# Patient Record
Sex: Male | Born: 1990 | Race: White | Hispanic: No | Marital: Single | Smoking: Current every day smoker
Health system: Southern US, Community
[De-identification: ages and names within clinical notes are randomized; demographics above are authoritative.]

## PROBLEM LIST (undated history)

## (undated) DIAGNOSIS — K219 Gastro-esophageal reflux disease without esophagitis: Secondary | ICD-10-CM

## (undated) DIAGNOSIS — J982 Interstitial emphysema: Secondary | ICD-10-CM

## (undated) DIAGNOSIS — S82843A Displaced bimalleolar fracture of unspecified lower leg, initial encounter for closed fracture: Secondary | ICD-10-CM

## (undated) DIAGNOSIS — R0602 Shortness of breath: Secondary | ICD-10-CM

## (undated) DIAGNOSIS — F909 Attention-deficit hyperactivity disorder, unspecified type: Secondary | ICD-10-CM

## (undated) DIAGNOSIS — R51 Headache: Secondary | ICD-10-CM

---
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
## 2011-08-07 DIAGNOSIS — S82843A Displaced bimalleolar fracture of unspecified lower leg, initial encounter for closed fracture: Secondary | ICD-10-CM

## 2011-08-07 HISTORY — DX: Displaced bimalleolar fracture of unspecified lower leg, initial encounter for closed fracture: S82.843A

## 2011-08-08 ENCOUNTER — Encounter (HOSPITAL_BASED_OUTPATIENT_CLINIC_OR_DEPARTMENT_OTHER): Payer: Self-pay | Admitting: *Deleted

## 2011-08-10 ENCOUNTER — Encounter (HOSPITAL_BASED_OUTPATIENT_CLINIC_OR_DEPARTMENT_OTHER)
Admission: RE | Disposition: A | Discharge status: Home / Self Care | Payer: Self-pay | Source: Ambulatory Visit | Attending: Orthopedic Surgery

## 2011-08-10 ENCOUNTER — Encounter (HOSPITAL_BASED_OUTPATIENT_CLINIC_OR_DEPARTMENT_OTHER): Payer: Self-pay | Admitting: Anesthesiology

## 2011-08-10 ENCOUNTER — Ambulatory Visit (HOSPITAL_BASED_OUTPATIENT_CLINIC_OR_DEPARTMENT_OTHER)
Admission: RE | Admit: 2011-08-10 | Discharge: 2011-08-10 | Disposition: A | Discharge status: Home / Self Care | Payer: BC Managed Care – PPO | Source: Ambulatory Visit | Attending: Orthopedic Surgery | Admitting: Orthopedic Surgery

## 2011-08-10 ENCOUNTER — Encounter (HOSPITAL_BASED_OUTPATIENT_CLINIC_OR_DEPARTMENT_OTHER): Payer: Self-pay | Admitting: *Deleted

## 2011-08-10 ENCOUNTER — Ambulatory Visit (HOSPITAL_BASED_OUTPATIENT_CLINIC_OR_DEPARTMENT_OTHER): Payer: BC Managed Care – PPO | Admitting: Anesthesiology

## 2011-08-10 ENCOUNTER — Ambulatory Visit (HOSPITAL_COMMUNITY): Payer: BC Managed Care – PPO

## 2011-08-10 DIAGNOSIS — W19XXXA Unspecified fall, initial encounter: Secondary | ICD-10-CM | POA: Insufficient documentation

## 2011-08-10 DIAGNOSIS — K219 Gastro-esophageal reflux disease without esophagitis: Secondary | ICD-10-CM | POA: Insufficient documentation

## 2011-08-10 DIAGNOSIS — Z01812 Encounter for preprocedural laboratory examination: Secondary | ICD-10-CM | POA: Insufficient documentation

## 2011-08-10 DIAGNOSIS — S82843A Displaced bimalleolar fracture of unspecified lower leg, initial encounter for closed fracture: Secondary | ICD-10-CM | POA: Insufficient documentation

## 2011-08-10 DIAGNOSIS — S82899A Other fracture of unspecified lower leg, initial encounter for closed fracture: Secondary | ICD-10-CM

## 2011-08-10 HISTORY — DX: Displaced bimalleolar fracture of unspecified lower leg, initial encounter for closed fracture: S82.843A

## 2011-08-10 HISTORY — PX: ORIF ANKLE FRACTURE: SHX5408

## 2011-08-10 HISTORY — DX: Gastro-esophageal reflux disease without esophagitis: K21.9

## 2011-08-10 SURGERY — OPEN REDUCTION INTERNAL FIXATION (ORIF) ANKLE FRACTURE
Anesthesia: General | Site: Ankle | Laterality: Left | Wound class: Clean

## 2011-08-10 MED ORDER — LACTATED RINGERS IV SOLN
INTRAVENOUS | Status: DC
  Administered 2011-08-10 (×2): via INTRAVENOUS

## 2011-08-10 MED ORDER — FENTANYL CITRATE 0.05 MG/ML IJ SOLN
50.0000 ug | INTRAMUSCULAR | Status: DC | PRN
  Administered 2011-08-10: 100 ug via INTRAVENOUS

## 2011-08-10 MED ORDER — MEPERIDINE HCL 25 MG/ML IJ SOLN: 6.2500 mg | INTRAMUSCULAR | Status: DC | PRN

## 2011-08-10 MED ORDER — CEFAZOLIN SODIUM 1-5 GM-% IV SOLN
1.0000 g | Freq: Once | INTRAVENOUS | Status: AC
  Administered 2011-08-10: 1 g via INTRAVENOUS

## 2011-08-10 MED ORDER — PROPOFOL 10 MG/ML IV EMUL
INTRAVENOUS | Status: DC | PRN
  Administered 2011-08-10: 200 mg via INTRAVENOUS

## 2011-08-10 MED ORDER — ONDANSETRON HCL 4 MG/2ML IJ SOLN
INTRAMUSCULAR | Status: DC | PRN
  Administered 2011-08-10: 4 mg via INTRAVENOUS

## 2011-08-10 MED ORDER — LIDOCAINE HCL (CARDIAC) 20 MG/ML IV SOLN
INTRAVENOUS | Status: DC | PRN
  Administered 2011-08-10: 100 mg via INTRAVENOUS

## 2011-08-10 MED ORDER — HYDROMORPHONE HCL PF 1 MG/ML IJ SOLN: 0.2500 mg | INTRAMUSCULAR | Status: DC | PRN

## 2011-08-10 MED ORDER — OXYCODONE-ACETAMINOPHEN 5-325 MG PO TABS: 1.0000 | ORAL_TABLET | ORAL | Status: AC | PRN

## 2011-08-10 MED ORDER — 0.9 % SODIUM CHLORIDE (POUR BTL) OPTIME
TOPICAL | Status: DC | PRN
  Administered 2011-08-10: 1000 mL

## 2011-08-10 MED ORDER — DEXAMETHASONE SODIUM PHOSPHATE 4 MG/ML IJ SOLN
INTRAMUSCULAR | Status: DC | PRN
  Administered 2011-08-10: 10 mg via INTRAVENOUS

## 2011-08-10 MED ORDER — MORPHINE SULFATE 2 MG/ML IJ SOLN: 0.0500 mg/kg | INTRAMUSCULAR | Status: DC | PRN

## 2011-08-10 MED ORDER — ONDANSETRON HCL 4 MG/2ML IJ SOLN: 4.0000 mg | Freq: Once | INTRAMUSCULAR | Status: DC | PRN

## 2011-08-10 MED ORDER — OXYCODONE-ACETAMINOPHEN 5-325 MG PO TABS
2.0000 | ORAL_TABLET | Freq: Once | ORAL | Status: AC
  Administered 2011-08-10: 2 via ORAL

## 2011-08-10 MED ORDER — MIDAZOLAM HCL 5 MG/5ML IJ SOLN
INTRAMUSCULAR | Status: DC | PRN
  Administered 2011-08-10: 1 mg via INTRAVENOUS

## 2011-08-10 MED ORDER — BUPIVACAINE-EPINEPHRINE PF 0.5-1:200000 % IJ SOLN
INTRAMUSCULAR | Status: DC | PRN
  Administered 2011-08-10: 30 mL

## 2011-08-10 MED ORDER — MIDAZOLAM HCL 2 MG/2ML IJ SOLN
1.0000 mg | INTRAMUSCULAR | Status: DC | PRN
  Administered 2011-08-10: 2 mg via INTRAVENOUS

## 2011-08-10 SURGICAL SUPPLY — 80 items
BANDAGE ACE 4 STERILE (GAUZE/BANDAGES/DRESSINGS) ×2 IMPLANT
BANDAGE ELASTIC 4 VELCRO ST LF (GAUZE/BANDAGES/DRESSINGS) ×2 IMPLANT
BANDAGE ELASTIC 6 VELCRO ST LF (GAUZE/BANDAGES/DRESSINGS) ×2 IMPLANT
BANDAGE ESMARK 6X9 LF (GAUZE/BANDAGES/DRESSINGS) IMPLANT
BENZOIN TINCTURE PRP APPL 2/3 (GAUZE/BANDAGES/DRESSINGS) ×2 IMPLANT
BIT DRILL 2.5X110 QC LCP DISP (BIT) ×2 IMPLANT
BIT DRILL QC 3.5X110 (BIT) ×2 IMPLANT
BLADE SURG 15 STRL LF DISP TIS (BLADE) ×1 IMPLANT
BLADE SURG 15 STRL SS (BLADE) ×1
BLADE SURG ROTATE 9660 (MISCELLANEOUS) ×2 IMPLANT
BNDG COHESIVE 4X5 TAN STRL (GAUZE/BANDAGES/DRESSINGS) ×2 IMPLANT
BNDG ESMARK 4X9 LF (GAUZE/BANDAGES/DRESSINGS) IMPLANT
BNDG ESMARK 6X9 LF (GAUZE/BANDAGES/DRESSINGS)
CANISTER SUCTION 1200CC (MISCELLANEOUS) IMPLANT
CHLORAPREP W/TINT 26ML (MISCELLANEOUS) ×2 IMPLANT
CLOSURE STERI STRIP 1/2 X4 (GAUZE/BANDAGES/DRESSINGS) ×2 IMPLANT
CLOTH BEACON ORANGE TIMEOUT ST (SAFETY) ×2 IMPLANT
COVER TABLE BACK 60X90 (DRAPES) ×2 IMPLANT
DECANTER SPIKE VIAL GLASS SM (MISCELLANEOUS) IMPLANT
DRAPE C-ARM 42X72 X-RAY (DRAPES) ×2 IMPLANT
DRAPE EXTREMITY T 121X128X90 (DRAPE) ×2 IMPLANT
DRAPE OEC MINIVIEW 54X84 (DRAPES) ×2 IMPLANT
DRAPE U 20/CS (DRAPES) ×2 IMPLANT
DRAPE U-SHAPE 47X51 STRL (DRAPES) IMPLANT
DRSG EMULSION OIL 3X3 NADH (GAUZE/BANDAGES/DRESSINGS) IMPLANT
ELECT REM PT RETURN 9FT ADLT (ELECTROSURGICAL) ×2
ELECTRODE REM PT RTRN 9FT ADLT (ELECTROSURGICAL) ×1 IMPLANT
GAUZE SPONGE 4X4 16PLY XRAY LF (GAUZE/BANDAGES/DRESSINGS) IMPLANT
GLOVE BIOGEL M 7.0 STRL (GLOVE) ×2 IMPLANT
GLOVE BIOGEL PI IND STRL 7.0 (GLOVE) ×2 IMPLANT
GLOVE BIOGEL PI IND STRL 8 (GLOVE) ×2 IMPLANT
GLOVE BIOGEL PI INDICATOR 7.0 (GLOVE) ×2
GLOVE BIOGEL PI INDICATOR 8 (GLOVE) ×2
GLOVE ECLIPSE 7.5 STRL STRAW (GLOVE) ×6 IMPLANT
GLOVE SKINSENSE NS SZ6.5 (GLOVE) ×2
GLOVE SKINSENSE STRL SZ6.5 (GLOVE) ×2 IMPLANT
GOWN PREVENTION PLUS XLARGE (GOWN DISPOSABLE) ×2 IMPLANT
GOWN PREVENTION PLUS XXLARGE (GOWN DISPOSABLE) ×2 IMPLANT
KIT 1/3 TUB PL 8H 97M (Orthopedic Implant) ×1 IMPLANT
NEEDLE HYPO 22GX1.5 SAFETY (NEEDLE) IMPLANT
NS IRRIG 1000ML POUR BTL (IV SOLUTION) ×2 IMPLANT
PACK BASIN DAY SURGERY FS (CUSTOM PROCEDURE TRAY) ×2 IMPLANT
PAD CAST 4YDX4 CTTN HI CHSV (CAST SUPPLIES) ×1 IMPLANT
PADDING CAST ABS 4INX4YD NS (CAST SUPPLIES) ×2
PADDING CAST ABS COTTON 4X4 ST (CAST SUPPLIES) ×2 IMPLANT
PADDING CAST COTTON 4X4 STRL (CAST SUPPLIES) ×1
PADDING CAST COTTON 6X4 STRL (CAST SUPPLIES) IMPLANT
PADDING WEBRIL 4 STERILE (GAUZE/BANDAGES/DRESSINGS) ×2 IMPLANT
PADDING WEBRIL 6 STERILE (GAUZE/BANDAGES/DRESSINGS) ×2 IMPLANT
PENCIL BUTTON HOLSTER BLD 10FT (ELECTRODE) ×2 IMPLANT
PROS 1/3 TUB PL 8H 97M (Orthopedic Implant) ×2 IMPLANT
SCREW CORTEX 3.5 12MM (Screw) ×4 IMPLANT
SCREW CORTEX 3.5 14MM (Screw) ×2 IMPLANT
SCREW CORTEX 3.5 18MM (Screw) ×1 IMPLANT
SCREW LOCK CORT ST 3.5X12 (Screw) ×4 IMPLANT
SCREW LOCK CORT ST 3.5X14 (Screw) ×2 IMPLANT
SCREW LOCK CORT ST 3.5X18 (Screw) ×1 IMPLANT
SPLINT FAST PLASTER 5X30 (CAST SUPPLIES) ×15
SPLINT PLASTER CAST FAST 5X30 (CAST SUPPLIES) ×15 IMPLANT
SPLINT PLASTER CAST XFAST 5X30 (CAST SUPPLIES) ×1 IMPLANT
SPLINT PLASTER XFAST SET 5X30 (CAST SUPPLIES) ×1
SPONGE GAUZE 4X4 12PLY (GAUZE/BANDAGES/DRESSINGS) ×2 IMPLANT
SPONGE LAP 4X18 X RAY DECT (DISPOSABLE) IMPLANT
STAPLER VISISTAT (STAPLE) IMPLANT
STOCKINETTE 6  STRL (DRAPES) ×1
STOCKINETTE 6 STRL (DRAPES) ×1 IMPLANT
STRIP CLOSURE SKIN 1/2X4 (GAUZE/BANDAGES/DRESSINGS) IMPLANT
SUCTION FRAZIER TIP 10 FR DISP (SUCTIONS) ×2 IMPLANT
SUT ETHILON 4 0 PS 2 18 (SUTURE) IMPLANT
SUT MON AB 4-0 PC3 18 (SUTURE) ×2 IMPLANT
SUT VIC AB 2-0 SH 27 (SUTURE) ×1
SUT VIC AB 2-0 SH 27XBRD (SUTURE) ×1 IMPLANT
SUT VIC AB 3-0 FS2 27 (SUTURE) IMPLANT
SUT VICRYL 4-0 PS2 18IN ABS (SUTURE) IMPLANT
SYR 20CC LL (SYRINGE) IMPLANT
SYR BULB 3OZ (MISCELLANEOUS) ×2 IMPLANT
TOWEL OR NON WOVEN STRL DISP B (DISPOSABLE) ×2 IMPLANT
TUBE CONNECTING 20X1/4 (TUBING) IMPLANT
UNDERPAD 30X30 INCONTINENT (UNDERPADS AND DIAPERS) ×2 IMPLANT
WATER STERILE IRR 1000ML POUR (IV SOLUTION) ×2 IMPLANT

## 2011-08-10 NOTE — Anesthesia Postprocedure Evaluation (Signed)
Anesthesia Post Note  Patient: Fernando Jimenez [facility]  Procedure(s) Performed:  OPEN REDUCTION INTERNAL FIXATION (ORIF) ANKLE FRACTURE  Anesthesia type: general  Patient location: PACU  Post pain: Pain level controlled  Post assessment: Patient's Cardiovascular Status Stable  Last Vitals:  Filed Vitals:   08/10/11 1715  BP: 138/80  Pulse: 87  Temp: 36.7 C  Resp: 18    Post vital signs: Reviewed and stable  Level of consciousness: sedated  Complications: No apparent anesthesia complications

## 2011-08-10 NOTE — Op Note (Signed)
Procedure(s): OPEN REDUCTION INTERNAL FIXATION (ORIF) ANKLE FRACTURE Procedure Note  Fernando Jimenez [facility] male 21 y.o. 08/10/2011  Procedure(s) and Anesthesia Type:    * OPEN REDUCTION INTERNAL FIXATION (ORIF) BIMALLEOLAR (POSTERIOR AND LATERAL) ANKLE FRACTURE - General with preop regional  Surgeon(s) and Role:    * Mable Paris, MD - Primary   Indications:  21 y.o. male s/p fall with left ankle fracture. Indicated for surgery to promote anatomic restoration of joint.     Surgeon: Mable Paris   Assistants: none  Anesthesia: General endotracheal anesthesia and IV regional  ASA Class: 1    Procedure Detail  OPEN REDUCTION INTERNAL FIXATION (ORIF) ANKLE FRACTURE  Findings: After fixation of lateral malleolus fracture with 8 hole 1/3 tubular plate syndesmosis was stressed and was stable.  Small posterior malleolus fracture did not require fixation.  Estimated Blood Loss:  Minimal         Drains: none  Blood Given: none         Specimens: none        Complications:  * No complications entered in OR log *         Disposition: PACU - hemodynamically stable.         Condition: stable    Procedure:            The patient was identified in the preoperative holding area where I personally marked the operative site after verifying site side and procedure with the patient. The patient was taken back  to the operating room where general anesthesia was induced without Complication. The patient was placed in supine position with a bump under the operative hip. A non sterile tourniquet was applied to the thigh. The patient did receive IV antibiotics prior to the incision.   After the appropriate time-out, the limb was exsanguinated and the tourniquet was elevated to 300 mmHg.   A 10cm lateral incision was made over the fracture site and dissection was carried down the lateral fibula.  The fracture was a exposed and cleaned of hematoma.  Reduction was carried out  using reduction forceps and manipulation of the ankle.  An interfragmentary lag screw was placed.  A 8 hole 1/3 tubular plate was laid laterally and felt to be appropriate sized.  Holes proximal and distal to the fracture were sequentially drill, measured and filled with appropriate sized bicortical and unicortical screws.  Appropriate length and alignment were verified on fluoroscopic imaging.    The syndesmosis was stressed and felt to be completely stable with no widening.  Therefore no syndesmosis screws were placed.  The wounds were then copiously irrigated and closed in layers with 2-0 vicryl in a deep layer and 4-0 monocryl for skin closure.  Sterile dressings were then applied and well padded well molded splint in a plantigrade position was applied.  The tourniquet was let down for total tourniquet time of 46 minutes.  The patient was then allowed to awaken from GA, taken to the PACU in stable condition.  POSTOPERATIVE PLAN: The patient will be non-weightbearing on the operative Extremity and will follow up in 10-14 days for wound check.

## 2011-08-10 NOTE — Anesthesia Preprocedure Evaluation (Signed)
Anesthesia Evaluation  Patient identified by MRN, date of birth, ID band Patient awake    Reviewed: Allergy & Precautions, H&P , NPO status , Patient's Chart, lab work & pertinent test results  Airway Mallampati: I TM Distance: >3 FB Neck ROM: Full    Dental No notable dental hx. (+) Dental Advisory Given   Pulmonary    Pulmonary exam normal       Cardiovascular Regular Normal    Neuro/Psych    GI/Hepatic   Endo/Other    Renal/GU      Musculoskeletal   Abdominal   Peds  Hematology   Anesthesia Other Findings   Reproductive/Obstetrics                           Anesthesia Physical Anesthesia Plan  ASA: I  Anesthesia Plan: General   Post-op Pain Management:    Induction: Intravenous  Airway Management Planned: LMA  Additional Equipment:   Intra-op Plan:   Post-operative Plan: Extubation in OR  Informed Consent: I have reviewed the patients History and Physical, chart, labs and discussed the procedure including the risks, benefits and alternatives for the proposed anesthesia with the patient or authorized representative who has indicated his/her understanding and acceptance.   Dental advisory given  Plan Discussed with: CRNA, Anesthesiologist and Surgeon  Anesthesia Plan Comments:         Anesthesia Quick Evaluation

## 2011-08-10 NOTE — Progress Notes (Signed)
Assisted Dr. Crews with left, ultrasound guided, popliteal block. Side rails up, monitors on throughout procedure. See vital signs in flow sheet. Tolerated Procedure well. 

## 2011-08-10 NOTE — H&P (Signed)
Fernando Jimenez is an 21 y.o. male.   Chief Complaint: L ankle pain  HPI: L ankle fracture dislocation after sledding accident.    Past Medical History  Diagnosis Date  . GERD (gastroesophageal reflux disease)     prn OTC  . Bimalleolar ankle fracture 08/07/2011    left  - with disruption syndesmosis    History reviewed. No pertinent past surgical history.  History reviewed. No pertinent family history. Social History:  reports that he has never smoked. He has never used smokeless tobacco. He reports that he does not drink alcohol or use illicit drugs.  Allergies: No Known Allergies  Medications Prior to Admission  Medication Dose Route Frequency Provider Last Rate Last Dose  . lactated ringers infusion   Intravenous Continuous Bedelia Person, MD 20 mL/hr at 08/10/11 1341     No current outpatient prescriptions on file as of 08/10/2011.    No results found for this or any previous visit (from the past 48 hour(s)). No results found.  Review of Systems  All other systems reviewed and are negative.    Blood pressure 136/88, pulse 65, temperature 98.2 F (36.8 C), temperature source Oral, resp. rate 16, height 5\' 11"  (1.803 m), weight 77.111 kg (170 lb), SpO2 98.00%. Physical Exam  Constitutional: He is oriented to person, place, and time. He appears well-developed and well-nourished.  HENT:  Head: Atraumatic.  Eyes: EOM are normal.  Neck: Neck supple.  Cardiovascular: Intact distal pulses.   Respiratory: Effort normal.  GI: Soft.  Musculoskeletal:       LLE splint taken down.  Minimal swelling, skin intact. NVID   Neurological: He is alert and oriented to person, place, and time.  Skin: Skin is warm and dry.  Psychiatric: He has a normal mood and affect.     Assessment/Plan L ankle fracture dislocation.   Risks / benefits of surgery discussed Consent on chart  NPO for OR Preop antibiotics   Fernando Jimenez Fernando Jimenez 08/10/2011, 2:03 PM

## 2011-08-10 NOTE — Anesthesia Procedure Notes (Addendum)
Procedure Name: LMA Insertion Date/Time: 08/10/2011 3:09 PM Performed by: Zenia Resides D Pre-anesthesia Checklist: Patient identified, Emergency Drugs available, Suction available and Patient being monitored Patient Re-evaluated:Patient Re-evaluated prior to inductionOxygen Delivery Method: Circle System Utilized Preoxygenation: Pre-oxygenation with 100% oxygen Intubation Type: IV induction Ventilation: Mask ventilation without difficulty LMA: LMA inserted LMA Size: 4.0 Number of attempts: 1 Airway Equipment and Method: bite block Placement Confirmation: positive ETCO2 and breath sounds checked- equal and bilateral Tube secured with: Tape Dental Injury: Teeth and Oropharynx as per pre-operative assessment     Anesthesia Regional Block:  Popliteal block  Pre-Anesthetic Checklist: ,, timeout performed, Correct Patient, Correct Site, Correct Laterality, Correct Procedure, Correct Position, site marked, Risks and benefits discussed,  Surgical consent,  Pre-op evaluation,  At surgeon's request and post-op pain management  Laterality: Left and Lower  Prep: chloraprep       Needles:  Injection technique: Single-shot  Needle Type: Echogenic Stimulator Needle     Needle Length: 9cm  Needle Gauge: 22 and 22 G    Additional Needles:  Procedures: ultrasound guided Popliteal block Narrative:  Start time: 08/10/2011 2:38 PM End time: 08/10/2011 2:50 PM Injection made incrementally with aspirations every 5 mL.  Performed by: Personally  Anesthesiologist: Sheldon Silvan  Supraclavicular block

## 2011-08-10 NOTE — Transfer of Care (Signed)
Immediate Anesthesia Transfer of Care Note  Patient: Fernando Jimenez [facility]  Procedure(s) Performed:  OPEN REDUCTION INTERNAL FIXATION (ORIF) ANKLE FRACTURE  Patient Location: PACU  Anesthesia Type: General  Level of Consciousness: awake and oriented  Airway & Oxygen Therapy: Patient Spontanous Breathing and Patient connected to face mask oxygen  Post-op Assessment: Report given to PACU RN and Post -op Vital signs reviewed and stable  Post vital signs: Reviewed and stable  Complications: No apparent anesthesia complications

## 2011-08-11 LAB — POCT HEMOGLOBIN-HEMACUE: Hemoglobin: 13.8 g/dL (ref 13.0–17.0)

## 2011-08-15 ENCOUNTER — Encounter (HOSPITAL_BASED_OUTPATIENT_CLINIC_OR_DEPARTMENT_OTHER): Payer: Self-pay | Admitting: Orthopedic Surgery

## 2013-05-20 ENCOUNTER — Emergency Department (HOSPITAL_COMMUNITY): Payer: BC Managed Care – PPO

## 2013-05-20 ENCOUNTER — Emergency Department (HOSPITAL_BASED_OUTPATIENT_CLINIC_OR_DEPARTMENT_OTHER): Payer: BC Managed Care – PPO

## 2013-05-20 ENCOUNTER — Encounter (HOSPITAL_BASED_OUTPATIENT_CLINIC_OR_DEPARTMENT_OTHER): Payer: Self-pay | Admitting: Emergency Medicine

## 2013-05-20 ENCOUNTER — Inpatient Hospital Stay (HOSPITAL_BASED_OUTPATIENT_CLINIC_OR_DEPARTMENT_OTHER)
Admission: EM | Admit: 2013-05-20 | Discharge: 2013-05-24 | DRG: 541 | Disposition: A | Discharge status: Home / Self Care | Payer: BC Managed Care – PPO | Attending: Pulmonary Disease | Admitting: Pulmonary Disease

## 2013-05-20 DIAGNOSIS — J982 Interstitial emphysema: Principal | ICD-10-CM

## 2013-05-20 DIAGNOSIS — F988 Other specified behavioral and emotional disorders with onset usually occurring in childhood and adolescence: Secondary | ICD-10-CM | POA: Diagnosis present

## 2013-05-20 DIAGNOSIS — R404 Transient alteration of awareness: Secondary | ICD-10-CM | POA: Diagnosis present

## 2013-05-20 DIAGNOSIS — R000 Tachycardia, unspecified: Secondary | ICD-10-CM | POA: Diagnosis present

## 2013-05-20 DIAGNOSIS — R339 Retention of urine, unspecified: Secondary | ICD-10-CM | POA: Diagnosis not present

## 2013-05-20 DIAGNOSIS — R109 Unspecified abdominal pain: Secondary | ICD-10-CM

## 2013-05-20 DIAGNOSIS — F22 Delusional disorders: Secondary | ICD-10-CM | POA: Diagnosis present

## 2013-05-20 DIAGNOSIS — F19931 Other psychoactive substance use, unspecified with withdrawal delirium: Secondary | ICD-10-CM | POA: Diagnosis present

## 2013-05-20 DIAGNOSIS — K223 Perforation of esophagus: Secondary | ICD-10-CM | POA: Diagnosis present

## 2013-05-20 DIAGNOSIS — D72829 Elevated white blood cell count, unspecified: Secondary | ICD-10-CM | POA: Diagnosis present

## 2013-05-20 DIAGNOSIS — R112 Nausea with vomiting, unspecified: Secondary | ICD-10-CM | POA: Diagnosis present

## 2013-05-20 DIAGNOSIS — F909 Attention-deficit hyperactivity disorder, unspecified type: Secondary | ICD-10-CM | POA: Diagnosis present

## 2013-05-20 DIAGNOSIS — N200 Calculus of kidney: Secondary | ICD-10-CM | POA: Diagnosis present

## 2013-05-20 DIAGNOSIS — K219 Gastro-esophageal reflux disease without esophagitis: Secondary | ICD-10-CM | POA: Diagnosis present

## 2013-05-20 DIAGNOSIS — F17200 Nicotine dependence, unspecified, uncomplicated: Secondary | ICD-10-CM | POA: Diagnosis present

## 2013-05-20 DIAGNOSIS — F1910 Other psychoactive substance abuse, uncomplicated: Secondary | ICD-10-CM | POA: Diagnosis present

## 2013-05-20 DIAGNOSIS — G9340 Encephalopathy, unspecified: Secondary | ICD-10-CM | POA: Diagnosis present

## 2013-05-20 DIAGNOSIS — E860 Dehydration: Secondary | ICD-10-CM | POA: Diagnosis present

## 2013-05-20 HISTORY — DX: Headache: R51

## 2013-05-20 HISTORY — DX: Shortness of breath: R06.02

## 2013-05-20 HISTORY — DX: Interstitial emphysema: J98.2

## 2013-05-20 HISTORY — DX: Attention-deficit hyperactivity disorder, unspecified type: F90.9

## 2013-05-20 LAB — BASIC METABOLIC PANEL
BUN: 12 mg/dL (ref 6–23)
CO2: 23 mEq/L (ref 19–32)
Calcium: 11.4 mg/dL — ABNORMAL HIGH (ref 8.4–10.5)
Creatinine, Ser: 1 mg/dL (ref 0.50–1.35)
GFR calc Af Amer: >90 mL/min (ref >90)
Glucose, Bld: 114 mg/dL — ABNORMAL HIGH (ref 70–99)
Potassium: 4.5 mEq/L (ref 3.5–5.1)
Sodium: 139 mEq/L (ref 135–145)

## 2013-05-20 LAB — COMPREHENSIVE METABOLIC PANEL
Albumin: 4.5 g/dL (ref 3.5–5.2)
BUN: 7 mg/dL (ref 6–23)
Chloride: 102 mEq/L (ref 96–112)
Creatinine, Ser: 0.79 mg/dL (ref 0.50–1.35)
GFR calc Af Amer: >90 mL/min (ref >90)
Glucose, Bld: 87 mg/dL (ref 70–99)
Total Bilirubin: 0.6 mg/dL (ref 0.3–1.2)

## 2013-05-20 LAB — URINE MICROSCOPIC-ADD ON

## 2013-05-20 LAB — CBC WITH DIFFERENTIAL/PLATELET
Basophils Absolute: 0 10*3/uL (ref 0.0–0.1)
Basophils Relative: 0 % (ref 0–1)
Eosinophils Absolute: 0 10*3/uL (ref 0.0–0.7)
Eosinophils Relative: 0 % (ref 0–5)
Eosinophils Relative: 0 % (ref 0–5)
HCT: 48.1 % (ref 39.0–52.0)
Hemoglobin: 14.2 g/dL (ref 13.0–17.0)
Lymphocytes Relative: 7 % — ABNORMAL LOW (ref 12–46)
Lymphocytes Relative: 8 % — ABNORMAL LOW (ref 12–46)
Lymphs Abs: 0.8 10*3/uL (ref 0.7–4.0)
Lymphs Abs: 1.4 10*3/uL (ref 0.7–4.0)
MCH: 31.6 pg (ref 26.0–34.0)
MCHC: 35.8 g/dL (ref 30.0–36.0)
MCV: 88.3 fL (ref 78.0–100.0)
MCV: 89.2 fL (ref 78.0–100.0)
Monocytes Absolute: 0.4 10*3/uL (ref 0.1–1.0)
Monocytes Relative: 3 % (ref 3–12)
Monocytes Relative: 4 % (ref 3–12)
Neutro Abs: 15 10*3/uL — ABNORMAL HIGH (ref 1.7–7.7)
Neutrophils Relative %: 90 % — ABNORMAL HIGH (ref 43–77)
Neutrophils Relative %: 87 % — ABNORMAL HIGH (ref 43–77)
Platelets: 208 10*3/uL (ref 150–400)
RBC: 5.45 MIL/uL (ref 4.22–5.81)
RBC: 4.54 MIL/uL (ref 4.22–5.81)
RDW: 12.3 % (ref 11.5–15.5)
RDW: 12.6 % (ref 11.5–15.5)
WBC: 12.6 10*3/uL — ABNORMAL HIGH (ref 4.0–10.5)
WBC: 17.2 10*3/uL — ABNORMAL HIGH (ref 4.0–10.5)

## 2013-05-20 LAB — URINALYSIS, ROUTINE W REFLEX MICROSCOPIC
Glucose, UA: NEGATIVE mg/dL
Ketones, ur: >80 mg/dL — AB
Leukocytes, UA: NEGATIVE
Leukocytes, UA: NEGATIVE
Nitrite: NEGATIVE
Nitrite: NEGATIVE
Protein, ur: 100 mg/dL — AB
Specific Gravity, Urine: 1.034 — ABNORMAL HIGH (ref 1.005–1.030)
Specific Gravity, Urine: 1.015 (ref 1.005–1.030)
Urobilinogen, UA: 0.2 mg/dL (ref 0.0–1.0)
pH: 5.5 (ref 5.0–8.0)
pH: 5.5 (ref 5.0–8.0)

## 2013-05-20 LAB — TSH: TSH: 0.861 u[IU]/mL (ref 0.350–4.500)

## 2013-05-20 LAB — GLUCOSE, CAPILLARY
Glucose-Capillary: 93 mg/dL (ref 70–99)
Glucose-Capillary: 90 mg/dL (ref 70–99)
Glucose-Capillary: 90 mg/dL (ref 70–99)

## 2013-05-20 LAB — POCT I-STAT, CHEM 8
Calcium, Ion: 1.13 mmol/L (ref 1.12–1.23)
Glucose, Bld: 111 mg/dL — ABNORMAL HIGH (ref 70–99)
HCT: 53 % — ABNORMAL HIGH (ref 39.0–52.0)
Hemoglobin: 18 g/dL — ABNORMAL HIGH (ref 13.0–17.0)
Potassium: 4.4 mEq/L (ref 3.5–5.1)
TCO2: 22 mmol/L (ref 0–100)

## 2013-05-20 LAB — RAPID URINE DRUG SCREEN, HOSP PERFORMED
Barbiturates: NOT DETECTED
Cocaine: NOT DETECTED

## 2013-05-20 LAB — CG4 I-STAT (LACTIC ACID): Lactic Acid, Venous: 2.22 mmol/L — ABNORMAL HIGH (ref 0.5–2.2)

## 2013-05-20 LAB — INFLUENZA PANEL BY PCR (TYPE A & B)
Influenza A By PCR: NEGATIVE
Influenza B By PCR: NEGATIVE

## 2013-05-20 LAB — MRSA PCR SCREENING: MRSA by PCR: NEGATIVE

## 2013-05-20 LAB — POCT I-STAT 3, VENOUS BLOOD GAS (G3P V)
Acid-base deficit: 5 mmol/L — ABNORMAL HIGH (ref 0.0–2.0)
Patient temperature: 98.6
pCO2, Ven: 36.2 mmHg — ABNORMAL LOW (ref 45.0–50.0)

## 2013-05-20 LAB — LACTIC ACID, PLASMA: Lactic Acid, Venous: 1.2 mmol/L (ref 0.5–2.2)

## 2013-05-20 MED ORDER — VANCOMYCIN HCL IN DEXTROSE 1-5 GM/200ML-% IV SOLN
1000.0000 mg | Freq: Three times a day (TID) | INTRAVENOUS | Status: DC
  Administered 2013-05-20 – 2013-05-21 (×4): 1000 mg via INTRAVENOUS
  Filled 2013-05-20 (×7): qty 200

## 2013-05-20 MED ORDER — SODIUM CHLORIDE 0.9 % IJ SOLN
3.0000 mL | Freq: Two times a day (BID) | INTRAMUSCULAR | Status: DC
  Administered 2013-05-21 – 2013-05-23 (×4): 3 mL via INTRAVENOUS

## 2013-05-20 MED ORDER — MORPHINE SULFATE 2 MG/ML IJ SOLN
1.0000 mg | INTRAMUSCULAR | Status: DC | PRN
  Administered 2013-05-20: 1 mg via INTRAVENOUS
  Filled 2013-05-20: qty 1

## 2013-05-20 MED ORDER — IOHEXOL 300 MG/ML  SOLN: 50.0000 mL | Freq: Once | INTRAMUSCULAR | Status: DC | PRN

## 2013-05-20 MED ORDER — SODIUM CHLORIDE 0.9 % IV BOLUS (SEPSIS)
1000.0000 mL | Freq: Once | INTRAVENOUS | Status: AC
  Administered 2013-05-20: 1000 mL via INTRAVENOUS

## 2013-05-20 MED ORDER — PANTOPRAZOLE SODIUM 40 MG IV SOLR: 40.0000 mg | Freq: Two times a day (BID) | INTRAVENOUS | Status: DC

## 2013-05-20 MED ORDER — SODIUM CHLORIDE 0.9 % IV SOLN
3.0000 g | Freq: Once | INTRAVENOUS | Status: AC
  Administered 2013-05-20: 3 g via INTRAVENOUS
  Filled 2013-05-20: qty 3

## 2013-05-20 MED ORDER — SODIUM CHLORIDE 0.9 % IV SOLN
Freq: Once | INTRAVENOUS | Status: AC
  Administered 2013-05-20: 06:00:00 via INTRAVENOUS

## 2013-05-20 MED ORDER — ONDANSETRON HCL 4 MG/2ML IJ SOLN
INTRAMUSCULAR | Status: AC
  Filled 2013-05-20: qty 2

## 2013-05-20 MED ORDER — METHYLPHENIDATE HCL 5 MG PO TABS: 15.0000 mg | ORAL_TABLET | Freq: Three times a day (TID) | ORAL | Status: DC | PRN

## 2013-05-20 MED ORDER — IOHEXOL 300 MG/ML  SOLN
75.0000 mL | Freq: Once | INTRAMUSCULAR | Status: AC | PRN
  Administered 2013-05-20: 75 mL via INTRAVENOUS

## 2013-05-20 MED ORDER — IMIPENEM-CILASTATIN 500 MG IV SOLR
500.0000 mg | Freq: Three times a day (TID) | INTRAVENOUS | Status: DC
  Administered 2013-05-20 – 2013-05-21 (×4): 500 mg via INTRAVENOUS
  Filled 2013-05-20 (×8): qty 500

## 2013-05-20 MED ORDER — HYDROMORPHONE HCL PF 1 MG/ML IJ SOLN
1.0000 mg | Freq: Once | INTRAMUSCULAR | Status: AC
  Administered 2013-05-20: 1 mg via INTRAVENOUS

## 2013-05-20 MED ORDER — LORAZEPAM 2 MG/ML IJ SOLN
1.0000 mg | Freq: Once | INTRAMUSCULAR | Status: AC
  Administered 2013-05-20: 1 mg via INTRAVENOUS

## 2013-05-20 MED ORDER — ONDANSETRON HCL 4 MG PO TABS: 4.0000 mg | ORAL_TABLET | ORAL | Status: DC | PRN

## 2013-05-20 MED ORDER — IOHEXOL 300 MG/ML  SOLN
150.0000 mL | Freq: Once | INTRAMUSCULAR | Status: AC | PRN
  Administered 2013-05-20: 150 mL via ORAL

## 2013-05-20 MED ORDER — ONDANSETRON HCL 4 MG/2ML IJ SOLN
4.0000 mg | Freq: Four times a day (QID) | INTRAMUSCULAR | Status: DC | PRN
  Administered 2013-05-20: 4 mg via INTRAVENOUS
  Filled 2013-05-20: qty 2

## 2013-05-20 MED ORDER — HYDROMORPHONE HCL PF 1 MG/ML IJ SOLN
INTRAMUSCULAR | Status: AC
  Filled 2013-05-20: qty 1

## 2013-05-20 MED ORDER — METHYLPHENIDATE HCL ER (OSM) 18 MG PO TBCR: 54.0000 mg | EXTENDED_RELEASE_TABLET | Freq: Every day | ORAL | Status: DC | PRN

## 2013-05-20 MED ORDER — METHYLPHENIDATE HCL 5 MG PO TABS: 15.0000 mg | ORAL_TABLET | Freq: Two times a day (BID) | ORAL | Status: DC

## 2013-05-20 MED ORDER — PANTOPRAZOLE SODIUM 40 MG IV SOLR
40.0000 mg | Freq: Two times a day (BID) | INTRAVENOUS | Status: DC
  Administered 2013-05-20 – 2013-05-21 (×3): 40 mg via INTRAVENOUS
  Filled 2013-05-20 (×5): qty 40

## 2013-05-20 MED ORDER — KCL IN DEXTROSE-NACL 20-5-0.9 MEQ/L-%-% IV SOLN
INTRAVENOUS | Status: DC
  Administered 2013-05-20: 125 mL/h via INTRAVENOUS
  Administered 2013-05-21 – 2013-05-22 (×3): via INTRAVENOUS
  Filled 2013-05-20 (×7): qty 1000

## 2013-05-20 MED ORDER — PROMETHAZINE HCL 25 MG/ML IJ SOLN
25.0000 mg | Freq: Four times a day (QID) | INTRAMUSCULAR | Status: DC | PRN
  Administered 2013-05-20 – 2013-05-21 (×4): 25 mg via INTRAVENOUS
  Filled 2013-05-20 (×5): qty 1

## 2013-05-20 MED ORDER — ONDANSETRON HCL 4 MG/2ML IJ SOLN
4.0000 mg | Freq: Once | INTRAMUSCULAR | Status: AC
  Administered 2013-05-20: 4 mg via INTRAVENOUS
  Filled 2013-05-20: qty 2

## 2013-05-20 MED ORDER — SODIUM CHLORIDE 0.9 % IV SOLN: INTRAVENOUS | Status: DC

## 2013-05-20 MED ORDER — ONDANSETRON HCL 4 MG/2ML IJ SOLN
4.0000 mg | Freq: Four times a day (QID) | INTRAMUSCULAR | Status: DC | PRN
  Administered 2013-05-20 – 2013-05-23 (×3): 4 mg via INTRAVENOUS
  Filled 2013-05-20 (×3): qty 2

## 2013-05-20 MED ORDER — ACETAMINOPHEN 650 MG RE SUPP: 650.0000 mg | Freq: Four times a day (QID) | RECTAL | Status: DC | PRN

## 2013-05-20 MED ORDER — LORAZEPAM 2 MG/ML IJ SOLN
INTRAMUSCULAR | Status: AC
  Administered 2013-05-20: 1 mg via INTRAVENOUS
  Filled 2013-05-20: qty 1

## 2013-05-20 MED ORDER — IOHEXOL 300 MG/ML  SOLN
100.0000 mL | Freq: Once | INTRAMUSCULAR | Status: AC | PRN
  Administered 2013-05-20: 100 mL via INTRAVENOUS

## 2013-05-20 MED ORDER — ZOLPIDEM TARTRATE 5 MG PO TABS
5.0000 mg | ORAL_TABLET | Freq: Once | ORAL | Status: AC
  Administered 2013-05-20: 5 mg via ORAL
  Filled 2013-05-20: qty 1

## 2013-05-20 MED ORDER — HYDROMORPHONE HCL PF 1 MG/ML IJ SOLN
1.0000 mg | Freq: Once | INTRAMUSCULAR | Status: AC
  Administered 2013-05-20: 1 mg via INTRAVENOUS
  Filled 2013-05-20: qty 1

## 2013-05-20 MED ORDER — KETOROLAC TROMETHAMINE 30 MG/ML IJ SOLN
30.0000 mg | Freq: Four times a day (QID) | INTRAMUSCULAR | Status: DC | PRN
  Administered 2013-05-20 – 2013-05-21 (×4): 30 mg via INTRAVENOUS
  Filled 2013-05-20 (×4): qty 1

## 2013-05-20 MED ORDER — ACETAMINOPHEN 325 MG PO TABS: 650.0000 mg | ORAL_TABLET | Freq: Four times a day (QID) | ORAL | Status: DC | PRN

## 2013-05-20 MED ORDER — MORPHINE SULFATE 2 MG/ML IJ SOLN
1.0000 mg | INTRAMUSCULAR | Status: DC | PRN
  Administered 2013-05-20 – 2013-05-21 (×6): 2 mg via INTRAVENOUS
  Filled 2013-05-20 (×6): qty 1

## 2013-05-20 MED ORDER — ONDANSETRON HCL 4 MG/2ML IJ SOLN
4.0000 mg | Freq: Once | INTRAMUSCULAR | Status: AC
  Administered 2013-05-20: 4 mg via INTRAMUSCULAR

## 2013-05-20 MED ORDER — ONDANSETRON HCL 4 MG PO TABS: 4.0000 mg | ORAL_TABLET | Freq: Four times a day (QID) | ORAL | Status: DC | PRN

## 2013-05-20 MED ORDER — DICYCLOMINE HCL 10 MG/ML IM SOLN
20.0000 mg | Freq: Once | INTRAMUSCULAR | Status: AC
  Administered 2013-05-20: 20 mg via INTRAMUSCULAR
  Filled 2013-05-20: qty 2

## 2013-05-20 MED ORDER — MORPHINE SULFATE 4 MG/ML IJ SOLN
4.0000 mg | Freq: Once | INTRAMUSCULAR | Status: AC
  Administered 2013-05-20: 4 mg via INTRAVENOUS
  Filled 2013-05-20: qty 1

## 2013-05-20 NOTE — ED Notes (Signed)
To x-ray

## 2013-05-20 NOTE — Progress Notes (Signed)
Pt yelling out in pain,upon entering room pt has vomited in the trashcan and is  thrashing around in bed in pain. Pt's mother at bedside and states "his vomiting just sprays out forcefully",  Approximately 200cc emesis on floor and in trashcan.  Pt given IV zofran and morphine at 1022., pt has the "shivers", (as did in ER and at home mom states).  Pt also c/o headache, and has an icepack on forehead that was given to him when he was in the ER.

## 2013-05-20 NOTE — ED Notes (Signed)
Pt aware of need for urine specimen. 

## 2013-05-20 NOTE — ED Provider Notes (Addendum)
Care assumed after transfer from [facility].  22 yo male previously healthy with one month of intermittent abd pain, worse in the last 24 hours.  Workup at [facility] found no specific cause for abdominal pain, but did show pneumomediastinum.  CT surgery was consulted, and recommended CT chest with contrast and esophogram for possible esophageal rupture.  Pt arrives actively vomiting, diaphoretic, rigors.  He has received unasyn in route.  Filed Vitals:   05/20/13 0240 05/20/13 0258 05/20/13 0322 05/20/13 0408  BP: 138/80 138/77 152/92 131/84  Pulse: 87 90 116 92  Temp:    97.9 F (36.6 C)  TempSrc:    Oral  Resp:  15 34 16  Height:      Weight:      SpO2: 100% 99% 100% 98%    Pt to have radiologic studies, zofran ordered.  4:27 AM   5:17 AM Lactate repeated, now 2.22 down from 3.69.  Urine still with >80 ketones, will continue ivf hydration.  CT scan of chest with some bubbles of gas at distal esophagus, to have esophogram.  Results for orders placed during the hospital encounter of 05/20/13  CBC WITH DIFFERENTIAL      Result Value Range   WBC 12.6 (*) 4.0 - 10.5 K/uL   RBC 5.45  4.22 - 5.81 MIL/uL   Hemoglobin 17.2 (*) 13.0 - 17.0 g/dL   HCT 16.1  09.6 - 04.5 %   MCV 88.3  78.0 - 100.0 fL   MCH 31.6  26.0 - 34.0 pg   MCHC 35.8  30.0 - 36.0 g/dL   RDW 40.9  81.1 - 91.4 %   Platelets 264  150 - 400 K/uL   Neutrophils Relative % 90 (*) 43 - 77 %   Neutro Abs 11.4 (*) 1.7 - 7.7 K/uL   Lymphocytes Relative 7 (*) 12 - 46 %   Lymphs Abs 0.8  0.7 - 4.0 K/uL   Monocytes Relative 3  3 - 12 %   Monocytes Absolute 0.4  0.1 - 1.0 K/uL   Eosinophils Relative 0  0 - 5 %   Eosinophils Absolute 0.0  0.0 - 0.7 K/uL   Basophils Relative 0  0 - 1 %   Basophils Absolute 0.0  0.0 - 0.1 K/uL  BASIC METABOLIC PANEL      Result Value Range   Sodium 139  135 - 145 mEq/L   Potassium 4.5  3.5 - 5.1 mEq/L   Chloride 96  96 - 112 mEq/L   CO2 23  19 - 32 mEq/L   Glucose, Bld 114 (*) 70 - 99 mg/dL   BUN 12   6 - 23 mg/dL   Creatinine, Ser 7.82  0.50 - 1.35 mg/dL   Calcium 95.6 (*) 8.4 - 10.5 mg/dL   GFR calc non Af Amer >90  >90 mL/min   GFR calc Af Amer >90  >90 mL/min  URINALYSIS, ROUTINE W REFLEX MICROSCOPIC      Result Value Range   Color, Urine AMBER (*) YELLOW   APPearance CLEAR  CLEAR   Specific Gravity, Urine 1.034 (*) 1.005 - 1.030   pH 5.5  5.0 - 8.0   Glucose, UA NEGATIVE  NEGATIVE mg/dL   Hgb urine dipstick TRACE (*) NEGATIVE   Bilirubin Urine SMALL (*) NEGATIVE   Ketones, ur >80 (*) NEGATIVE mg/dL   Protein, ur 213 (*) NEGATIVE mg/dL   Urobilinogen, UA 0.2  0.0 - 1.0 mg/dL   Nitrite NEGATIVE  NEGATIVE  Leukocytes, UA NEGATIVE  NEGATIVE  URINE RAPID DRUG SCREEN (HOSP PERFORMED)      Result Value Range   Opiates NONE DETECTED  NONE DETECTED   Cocaine NONE DETECTED  NONE DETECTED   Benzodiazepines NONE DETECTED  NONE DETECTED   Amphetamines NONE DETECTED  NONE DETECTED   Tetrahydrocannabinol NONE DETECTED  NONE DETECTED   Barbiturates NONE DETECTED  NONE DETECTED  URINE MICROSCOPIC-ADD ON      Result Value Range   Squamous Epithelial / LPF FEW (*) RARE   WBC, UA 7-10  <3 WBC/hpf   RBC / HPF 0-2  <3 RBC/hpf   Bacteria, UA FEW (*) RARE   Urine-Other MUCOUS PRESENT    URINALYSIS, ROUTINE W REFLEX MICROSCOPIC      Result Value Range   Color, Urine YELLOW  YELLOW   APPearance CLEAR  CLEAR   Specific Gravity, Urine 1.015  1.005 - 1.030   pH 5.5  5.0 - 8.0   Glucose, UA NEGATIVE  NEGATIVE mg/dL   Hgb urine dipstick NEGATIVE  NEGATIVE   Bilirubin Urine NEGATIVE  NEGATIVE   Ketones, ur >80 (*) NEGATIVE mg/dL   Protein, ur NEGATIVE  NEGATIVE mg/dL   Urobilinogen, UA 0.2  0.0 - 1.0 mg/dL   Nitrite NEGATIVE  NEGATIVE   Leukocytes, UA NEGATIVE  NEGATIVE  POCT I-STAT, CHEM 8      Result Value Range   Sodium 139  135 - 145 mEq/L   Potassium 4.4  3.5 - 5.1 mEq/L   Chloride 103  96 - 112 mEq/L   BUN 12  6 - 23 mg/dL   Creatinine, Ser 1.47  0.50 - 1.35 mg/dL   Glucose,  Bld 829 (*) 70 - 99 mg/dL   Calcium, Ion 5.62  1.30 - 1.23 mmol/L   TCO2 22  0 - 100 mmol/L   Hemoglobin 18.0 (*) 13.0 - 17.0 g/dL   HCT 86.5 (*) 78.4 - 69.6 %  CG4 I-STAT (LACTIC ACID)      Result Value Range   Lactic Acid, Venous 3.69 (*) 0.5 - 2.2 mmol/L  POCT I-STAT 3, BLOOD GAS (G3P V)      Result Value Range   pH, Ven 7.355 (*) 7.250 - 7.300   pCO2, Ven 36.2 (*) 45.0 - 50.0 mmHg   pO2, Ven 38.0  30.0 - 45.0 mmHg   Bicarbonate 20.2  20.0 - 24.0 mEq/L   TCO2 21  0 - 100 mmol/L   O2 Saturation 70.0     Acid-base deficit 5.0 (*) 0.0 - 2.0 mmol/L   Patient temperature 98.6 F     Collection site BRACHIAL ARTERY     Drawn by RT     Sample type VENOUS     Comment NOTIFIED PHYSICIAN     Dg Chest 2 View  05/20/2013   CLINICAL DATA:  Chest pain.  EXAM: CHEST  2 VIEW  COMPARISON:  01/21/2009  FINDINGS: Extensive subcutaneous emphysema in the lower neck and upper left chest. There is pneumomediastinum with gas outlining the upper mediastinal more than lower mediastinal structures. Pneumomediastinum was seen on preceding abdominal CT. No definitive pneumothorax. No effusion, acute infiltrate, or pulmonary edema. No evidence of emphysema or airway obstruction. Normal heart size. No acute osseous trauma.  IMPRESSION: Pneumomediastinum and subcutaneous emphysema, preferentially in the upper chest.   Electronically Signed   By: Tiburcio Pea M.D.   On: 05/20/2013 02:41   Ct Chest W Contrast  05/20/2013   *RADIOLOGY REPORT*  Clinical Data:  Pneumomediastinum, assess for esophageal rupture.  CT CHEST WITH CONTRAST  Technique:  Multidetector CT imaging of the chest was performed following the standard protocol during bolus administration of intravenous contrast.  Contrast: 75mL OMNIPAQUE IOHEXOL 300 MG/ML  SOLN  Comparison: Chest radiograph May 20, 2013 and CT of the abdomen and pelvis May 20, 2013 at 0200 hours.  Findings: Please note, there is little contrast within the thoracic structures.   Pneumomediastinum, bubbles of gas around the esophagus with no discrete rent.  Tracheobronchial tree appears patent midline.  No pneumothorax.  Gas dissects into the epidural space, is seen along the left greater than right chest walls extending into the neck. No fluid collections within the neck. Small bubbles of gas may be intraluminal within the distal thoracic esophagus, axial 46/77.  The heart and pericardium are not suspicious.  No pleural effusions, focal consolidations, pulmonary nodules or masses.  Osseous structures are not suspicious, specifically no rib fracture.  IMPRESSION: Pneumomediastinum , with subcutaneous gas dissecting into the neck and epidural space.  No pneumothorax.  No fluid collections within the mediastinum.  A few bubbles of gas at the level of the distal thoracic esophagus which may in fact be intraluminal. Esophagram may be more sensitive for evaluation of small leak.   Original Report Authenticated By: Awilda Metro   Ct Abdomen Pelvis W Contrast  05/20/2013   *RADIOLOGY REPORT*  Clinical Data: Abdominal pain and emesis, evaluate for appendicitis.  CT ABDOMEN AND PELVIS WITH CONTRAST  Technique:  Multidetector CT imaging of the abdomen and pelvis was performed following the standard protocol during bolus administration of intravenous contrast.  Contrast: OMNIPAQUE IOHEXOL 300 MG/ML  SOLN, 1 OMNIPAQUE IOHEXOL 300 MG/ML  SOLN  Comparison: None available at time of study interpretation.  Findings: Limited view of the lung bases are clear. Partially imaged pneumomediastinum with air within the anterior posterior mediastinum, incompletely assessed.  The liver, spleen, pancreas, gallbladder and adrenal glands are normal in size, morphology and enhancement characteristics.  The stomach, small and large bowel are normal in course and caliber without definite wall thickening or inflammatory changes though sensitivity may be decreased by lack of enteric contrast.  Normal retrocecal  appendix.  No free fluid nor free air.  The kidneys are well-located, demonstrating normal morphology, size and enhancement.  Mild right pelvicaliectasis.  2 mm nonobstructing left lower pole renal calculus.  Urinary bladder is partially distended and appears normal.  Great vessels are normal in course and caliber.  No lymphadenopathy by CT size criteria.  Internal reproductive organs appear normal. The included soft tissues are unremarkable. Grade 1 L5 S1 anterolisthesis associated with bilateral chronic L5 pars interarticularis defects.  Scattered Schmorl's nodes within the lumbar spine.  IMPRESSION: Partially imaged pneumomediastinum.  2 mm nonobstructing left lower pole renal calculus with mild right renal pelvic celiac disease, no frank hydronephrosis.  Normal appendix.  Findings discussed with and reconfirmed by Dr. Rhunette Croft on May 20, 2013 at 0210 hours.   Original Report Authenticated By: Rolin Barry, MD 05/20/13 0518  5:53 AM D/w Dr Tyrone Sage who has reviewed the films.  He feels the patient may have a microperforation of his esophagus, and recommends continuing the iv abx, NPO status.  He will consult on the patient.  He recommends medicine admission and continued workup of pt's abdominal pain.  Pt has begun to shiver again, is extremely agitated.  Will give ativan, dilaudid, and also order bentyl.  Plan for  admission.  Olivia Mackie, MD 05/20/13 [phone]

## 2013-05-20 NOTE — Consult Note (Signed)
[address]       [place] [address]             [phone]        [place] [facility] Medical Record #[id number] Date of Birth: 28-May-1991  Referring:[facility]:  Duane Lope, MD  Chief Complaint:    Chief Complaint  Patient presents with  . Emesis    History of Present Illness:     Fernando Jimenez is a 22 y.o. male who [facility] with nausea vomiting abdominal pain, fever chills and diaphorsis. Patient's symptoms started yesterday with multiple episodes of nausea vomiting and abdominal pain. Abdominal pain is in multiple areas and cramping in nature.  He has had  similar episodes  at least twice before in the last one month. Episodes have resolved spontaneously with out medical attention. At [facility] center CT abdomen pelvis done which showed on upper abdominal cuts pneumomediastinum and patient was transferred to [facility] ER.  CT chest with po contrast showed pneumomediastinum with no definite esophageal perforation or PTX.  Esophagogram  And CTconfirmed the pneumomediastinum but there was no active leak from the esophagus. Patient has been started antibiotics and kept n.p.o.  Patient has been having some subjective feeling of fever chills.. Has some chest pain since yesterday after vomiting but presently chest pain-free.   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    Current Activity/ Functional Status: Patient is independent with mobility/ambulation, transfers, ADL's, IADL's.     Past Medical History  Diagnosis Date  . GERD (gastroesophageal reflux disease)     prn OTC  . Bimalleolar ankle fracture 08/07/2011    left  - with disruption syndesmosis  . ADHD (attention deficit hyperactivity disorder)     Past Surgical History  Procedure Laterality Date  . Orif ankle fracture  08/10/2011      Procedure: OPEN REDUCTION INTERNAL FIXATION (ORIF) ANKLE FRACTURE;  Surgeon: Mable Paris, MD;  Location: [facility];  Service: Orthopedics;  Laterality: Left;    History  Smoking status  . Current Every Day Smoker  Smokeless tobacco  . Never Used    History  Alcohol Use  . Yes    Comment: once weekly     History   Social History  . Marital Status: Single    Spouse Name: N/A    Number of Children: N/A  . Years of Education: N/A   Occupational History  . 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 Smoking status: Current Every Day Smoker  . Smokeless tobacco: Never Used  . Alcohol Use: Yes     Comment: once weekly   . Drug Use: No  . Sexual Activity: Not on file    No Known Allergies  Current Facility-Administered Medications  Medication Dose Route Frequency Provider Last Rate Last Dose  . 0.9 %  sodium chloride infusion   Intravenous Continuous Eduard Clos, MD 150 mL/hr at 05/20/13 0829 150 mL/hr at 05/20/13 0829  . acetaminophen (TYLENOL) tablet 650 mg  650 mg Oral Q6H PRN Eduard Clos, MD       Or  . acetaminophen (TYLENOL) suppository 650 mg  650 mg Rectal Q6H PRN Eduard Clos, MD      .  morphine 2 MG/ML injection 1 mg  1 mg Intravenous Q4H PRN Eduard Clos, MD      . ondansetron [facility]) tablet 4 mg  4 mg Oral Q6H PRN Eduard Clos, MD       Or  . ondansetron [facility]) injection 4 mg  4 mg Intravenous Q6H PRN Eduard Clos, MD      . sodium chloride 0.9 % injection 3 mL  3 mL Intravenous Q12H Eduard Clos, MD        Prescriptions prior to admission  Medication Sig Dispense Refill  . methylphenidate (CONCERTA) 54 MG CR tablet Take 54 mg by mouth daily as needed (only takes when needed for concentration).      Marland Kitchen oxyCODONE-acetaminophen (PERCOCET) 5-325 MG per tablet Take 1 tablet by mouth every 4 (four) hours as  needed.         Family History  Problem Relation Age of Onset  . Diabetes Mellitus II Other      Review of Systems:     Cardiac Review of Systems: Y or N  Chest Pain [n]  Resting SOB [  n ] Exertional SOB  [ y ]  Orthopnea [  ]   Pedal Edema [  n ]    Palpitations [n  ] Syncope  [n  ]   Presyncope [ n  ]  General Review of Systems: [Y] = yes [  ]=no Constitional: recent weight change [ n ]; anorexia Cove.Etienne  ]; fatigue [ y ]; nausea Cove.Etienne  ]; night sweats Cove.Etienne  ]; fever [ y ]; or chills Cove.Etienne  ]                                                               Dental: poor dentition[n  ]; Last Dentist visit:   Eye : blurred vision [ n ]; diplopia [   n]; vision changes [  ];  Amaurosis fugax[n  ]; Resp: cough [ n ];  wheezing[ n ];  hemoptysis[n  ]; shortness of breath[  ]; paroxysmal nocturnal dyspnea[  ]; dyspnea on exertion[  ]; or orthopnea[  ];  GI:  gallstones[  ], vomiting[ severe ];  dysphagia[ y ]; melena[  n];  hematochezia [ n ]; heartburn[ n ];   Hx of  Colonoscopy[ n ]; GU: kidney stones [ n ]; hematuria[n  ];   dysuria [ n ];  nocturia[  ];  history of     obstruction [n  ]; urinary frequency [n  ]             Skin: rash, swelling[  ];, hair loss[  ];  peripheral edema[  ];  or itching[  ]; Musculosketetal: myalgias[ today ];  joint swelling[ n ];  joint erythema[n  ];  joint pain[ n ];  back pain[n  ];  Heme/Lymph: bruising[  ];  bleeding[  ];  anemia[  ];  Neuro: TIA[  n];  headaches[  ];  stroke[ n ];  vertigo[  ];  seizures[ n ];   paresthesias[  ];  difficulty walking[ n ];  Psych:depression[  ]; anxiety[  ];  Endocrine: diabetes[ n ];  thyroid dysfunction[  n];  Immunizations: Flu [ n ]; Pneumococcal[  n];  Other:  Physical Exam: BP 118/78  Pulse 109  Temp(Src) 98.2 F (36.8 C) (Oral)  Resp 21  Ht 5\' 11"  (1.803 m)  Wt 172 lb 2.9 oz (78.1 kg)  BMI 24.02 kg/m2  SpO2 100%  General appearance: alert, cooperative, appears stated age and mild distress Neurologic:  intact Heart: regular rate and rhythm, S1, S2 normal, no murmur, click, rub or gallop Lungs: clear to auscultation bilaterally Abdomen: bowel sounds present, not abdominal tenderness now, but patient notes  he could tolerate pushing on right lower quad of abdomen few hours ago, no abdominal masses evident Extremities: extremities normal, atraumatic, no cyanosis or edema and Homans sign is negative, no sign of DVT rt neck small amount of air palpable, slight nasal tone to voice   Diagnostic Studies & Laboratory data:     Recent Radiology Findings:   Dg Chest 2 View  05/20/2013   CLINICAL DATA:  Chest pain.  EXAM: CHEST  2 VIEW  COMPARISON:  01/21/2009  FINDINGS: Extensive subcutaneous emphysema in the lower neck and upper left chest. There is pneumomediastinum with gas outlining the upper mediastinal more than lower mediastinal structures. Pneumomediastinum was seen on preceding abdominal CT. No definitive pneumothorax. No effusion, acute infiltrate, or pulmonary edema. No evidence of emphysema or airway obstruction. Normal heart size. No acute osseous trauma.  IMPRESSION: Pneumomediastinum and subcutaneous emphysema, preferentially in the upper chest.   Electronically Signed   By: Tiburcio Pea M.D.   On: 05/20/2013 02:41   Ct Chest W Contrast  05/20/2013   *RADIOLOGY REPORT*  Clinical Data: Pneumomediastinum, assess for esophageal rupture.  CT CHEST WITH CONTRAST  Technique:  Multidetector CT imaging of the chest was performed following the standard protocol during bolus administration of intravenous contrast.  Contrast: 75mL OMNIPAQUE IOHEXOL 300 MG/ML  SOLN  Comparison: Chest radiograph May 20, 2013 and CT of the abdomen and pelvis May 20, 2013 at 0200 hours.  Findings: Please note, there is little contrast within the thoracic structures.  Pneumomediastinum, bubbles of gas around the esophagus with no discrete rent.  Tracheobronchial tree appears patent midline.  No pneumothorax.  Gas  dissects into the epidural space, is seen along the left greater than right chest walls extending into the neck. No fluid collections within the neck. Small bubbles of gas may be intraluminal within the distal thoracic esophagus, axial 46/77.  The heart and pericardium are not suspicious.  No pleural effusions, focal consolidations, pulmonary nodules or masses.  Osseous structures are not suspicious, specifically no rib fracture.  IMPRESSION: Pneumomediastinum , with subcutaneous gas dissecting into the neck and epidural space.  No pneumothorax.  No fluid collections within the mediastinum.  A few bubbles of gas at the level of the distal thoracic esophagus which may in fact be intraluminal. Esophagram may be more sensitive for evaluation of small leak.   Original Report Authenticated By: Awilda Metro   Ct Abdomen Pelvis W Contrast  05/20/2013   *RADIOLOGY REPORT*  Clinical Data: Abdominal pain and emesis, evaluate for appendicitis.  CT ABDOMEN AND PELVIS WITH CONTRAST  Technique:  Multidetector CT imaging of the abdomen and pelvis was performed following the standard protocol during bolus administration of intravenous contrast.  Contrast: OMNIPAQUE IOHEXOL 300 MG/ML  SOLN, 1 OMNIPAQUE IOHEXOL 300 MG/ML  SOLN  Comparison: None available at time of study interpretation.  Findings: Limited view of the lung bases are clear. Partially imaged pneumomediastinum with air within the anterior posterior mediastinum, incompletely assessed.  The liver, spleen, pancreas, gallbladder and  adrenal glands are normal in size, morphology and enhancement characteristics.  The stomach, small and large bowel are normal in course and caliber without definite wall thickening or inflammatory changes though sensitivity may be decreased by lack of enteric contrast.  Normal retrocecal appendix.  No free fluid nor free air.  The kidneys are well-located, demonstrating normal morphology, size and enhancement.  Mild right  pelvicaliectasis.  2 mm nonobstructing left lower pole renal calculus.  Urinary bladder is partially distended and appears normal.  Great vessels are normal in course and caliber.  No lymphadenopathy by CT size criteria.  Internal reproductive organs appear normal. The included soft tissues are unremarkable. Grade 1 L5 S1 anterolisthesis associated with bilateral chronic L5 pars interarticularis defects.  Scattered Schmorl's nodes within the lumbar spine.  IMPRESSION: Partially imaged pneumomediastinum.  2 mm nonobstructing left lower pole renal calculus with mild right renal pelvic celiac disease, no frank hydronephrosis.  Normal appendix.  Findings discussed with and reconfirmed by Dr. Rhunette Croft on May 20, 2013 at 0210 hours.   Original Report Authenticated By: Awilda Metro   Dg Esophagram W/water Sol Cm  05/20/2013   CLINICAL DATA:  Pneumomediastinum. Concern for esophageal rupture  EXAM: ESOPHOGRAM/BARIUM SWALLOW  TECHNIQUE: Single contrast examination was performed using isotonic water soluble contrast, followed by thin barium.  FINDINGS: Scout images over the neck and chest were first obtained. Pneumomediastinum was again seen, preferentially in the upper chest, extending into the lower neck and upper chest wall. The volume of pneumomediastinum is stable from prior chest radiography. Small sips of isotonic water soluble contrast was administered via straw in the upper right position. Due to patient tremulousness and repeated vomiting, positioning and coverage is suboptimal. No esophageal or pharyngeal leak or obstruction was identified. Next, sips of thin barium were administered. No extravasation seen. The caliber and contour of the pharynx and esophagus was normal, without evidence of obstruction. Postprocedure frontal chest radiograph was obtained, showing no effusion, contrast leakage, or pneumothorax.  IMPRESSION: 1. No evidence of esophageal or pharyngeal leak. 2. Stable volume of  pneumomediastinum and subcutaneous emphysema compared to earlier the same day.  : COMPARISON:  None.   Electronically Signed   By: Tiburcio Pea M.D.   On: 05/20/2013 05:35      Recent Lab Findings: Lab Results  Component Value Date   WBC 17.2* 05/20/2013   HGB 14.2 05/20/2013   HCT 40.5 05/20/2013   PLT 208 05/20/2013   GLUCOSE 87 05/20/2013   ALT 11 05/20/2013   AST 20 05/20/2013   NA 137 05/20/2013   K 3.9 05/20/2013   CL 102 05/20/2013   CREATININE 0.79 05/20/2013   BUN 7 05/20/2013   CO2 18* 05/20/2013      Assessment / Plan:    1) No radiogaphic evidence of esophageal perforation or PTX, no indication for thoracic surgical intervention, Would keep NPO for now and treat with antibiotics for the possibility of micro perforation of esophagus. 2) Of Obvious  Concern is the cause of the episodic development of symptoms that result in fever chill and vomiting. Consider GI consult.        Delight Ovens MD      [address] [place] [place] [address] Office [phone]   Beeper [phone]  05/20/2013 9:26 AM

## 2013-05-20 NOTE — Progress Notes (Signed)
[facility] Progress Note [place] TEAM 1 - Stepdown ICU Team   Fernando Jimenez [place] ZOX:[id number] DOB: 1991/05/19 DOA: 05/20/2013 PCP:  Duane Lope, MD  Brief narrative: 22 year old male patient who initially presented to [facility] ER with nausea, vomiting and abdominal pain. Patient had acute onset of symptoms 24 hours prior to presentation and reported multiple episodes with nausea and vomiting and abdominal pain. The abdominal pain was in multiple areas. It was described as dull and aching and colicky in nature. He reported similar symptoms twice before the past month but because the symptoms resolved spontaneously and rapidly the patient did not seek medical care. CT of the abdomen and pelvis completed in the ER demonstrated a pneumomediastinum.  The patient was transferred to [facility]. Subsequent CT the chest in the ER at [facility] did re-demonstrate a pneumomediastinum. Subsequent esophagram confirmed similar findings but there was no active leaking from the esophagus. Dr. Lowella Fairy with cardiothoracic surgery was consulted. Patient was made n.p.o. In addition to the above symptoms the patient had been having subjective symptoms of fever and chills but had not taken his temperature. Had some minimal chest pain after vomiting 24hrs prior to presentation but was chest pain-free by the time he arrived to the emergency department. No shortness of breath.  Assessment/Plan:    Pneumomediastinum -per Dr. Tyrone Sage no indications for surgery - no radiographic evidence of esophageal perforation or pneumothorax but likely has a microperforation of the esophagus -cont NPO and IVFs w/ KCL -GI plans to repeat esophagram in 2 days    Nausea & vomiting/Abdominal pain/Leukocytosis -etiology unclear -today c/o more focal epigastric pain - LFTs normal and recent emesis w/o blood so doubt PUD or biliary colic -GI following -cont IV MSO4 and Toradol -cont PPI IV -tx sx's  (Zofran/phenergan) -cont empiric anbxs    ADD (attention deficit disorder) -pt extremely hyper during exam - on high dose Concerta at home but told RN only takes prn - will need to further d/w pt need to take meds regularly -have asked pharmacy to dose with Ritalin since Concerta CR not available IP - will allow to take this medication only with a sip of water -sx management esp pain best controlled if ADD sx's managed    Renal calculi -very tiny and non obstructing - also pain is epigastric and not flank so doubt renal colic   DVT prophylaxis: SCDs Code Status: Full Family Communication: Patient Disposition Plan/Expected LOS: Transfer to floor  Consultants: [facility] Gastroenterology  Procedures: None  Antibiotics: Unasyn times one dose in the emergency department Primaxin 10/13 >>>  HPI/Subjective: Patient alert but very anxious and hypermotor. Endorsed no pain during the examination by my attending physician but later developed nausea and vomiting and severe epigastric pain.  Objective: Blood pressure 129/79, pulse 87, temperature 98.7 F (37.1 C), temperature source Oral, resp. rate 23, height 5\' 11"  (1.803 m), weight 78.1 kg (172 lb 2.9 oz), SpO2 100.00%.  Intake/Output Summary (Last 24 hours) at 05/20/13 1411 Last data filed at 05/20/13 8119  Gross per 24 hour  Intake   4000 ml  Output    400 ml  Net   3600 ml   Exam: F/U exam completed   Scheduled Meds:  Scheduled Meds: . imipenem-cilastatin  500 mg Intravenous Q8H  . pantoprazole (PROTONIX) IV  40 mg Intravenous Q12H  . sodium chloride  3 mL Intravenous Q12H  . vancomycin  1,000 mg Intravenous Q8H   Data Reviewed: Basic Metabolic Panel:  Recent  Labs Lab 05/20/13 0122 05/20/13 0134 05/20/13 0830  NA 139 139 137  K 4.5 4.4 3.9  CL 96 103 102  CO2 23  --  18*  GLUCOSE 114* 111* 87  BUN 12 12 7   CREATININE 1.00 1.00 0.79  CALCIUM 11.4*  --  9.1   Liver Function Tests:  Recent Labs Lab  05/20/13 0830  AST 20  ALT 11  ALKPHOS 83  BILITOT 0.6  PROT 7.3  ALBUMIN 4.5   CBC:  Recent Labs Lab 05/20/13 0122 05/20/13 0134 05/20/13 0830  WBC 12.6*  --  17.2*  NEUTROABS 11.4*  --  15.0*  HGB 17.2* 18.0* 14.2  HCT 48.1 53.0* 40.5  MCV 88.3  --  89.2  PLT 264  --  208    Recent Results (from the past 240 hour(s))  MRSA PCR SCREENING     Status: None   Collection Time    05/20/13  7:47 AM      Result Value Range Status   MRSA by PCR NEGATIVE  NEGATIVE Final   Comment:            The GeneXpert MRSA Assay (FDA     approved for NASAL specimens     only), is one component of a     comprehensive MRSA colonization     surveillance program. It is not     intended to diagnose MRSA     infection nor to guide or     monitor treatment for     MRSA infections.     Studies:  Recent x-ray studies have been reviewed in detail by the Attending Physician     Junious Silk, ANP [facility] Office  [phone] Pager [phone]  **If unable to reach the above provider after paging please contact the Flow Manager @ [phone]  On-Call/Text Page:      [email or web address]      password [email or web address]  If 7PM-7AM, please contact night-coverage [email or web address] Password [email or web address] 05/20/2013, 2:11 PM   LOS: 0 days   I have personally examined this patient and reviewed the entire database. I have reviewed the above note, made any necessary editorial changes, and agree with its content.  Lonia Blood, MD [facility]

## 2013-05-20 NOTE — ED Notes (Addendum)
Pt to CT, alert, NAD, calm, interactive, speech clear, VSS, no longer vomiting.

## 2013-05-20 NOTE — ED Notes (Signed)
MD at bedside. 

## 2013-05-20 NOTE — ED Notes (Addendum)
Over the past month pt has had episodes of intermittent vomiting. States with vomiting episode he has general abd pain. Denies any fevers. States he has had chills/sweating. Has not been seen for this and has not taken any meds for same. Denies any urinary symptoms. Denies any diarrhea

## 2013-05-20 NOTE — Progress Notes (Signed)
ANTIBIOTIC CONSULT NOTE - INITIAL  Pharmacy Consult for Primaxin, Vancomycin Indication: pneumomediastinum  No Known Allergies  Patient Measurements: Height: 5\' 11"  (180.3 cm) Weight: 172 lb 2.9 oz (78.1 kg) IBW/kg (Calculated) : 75.3 Adjusted Body Weight:   Vital Signs: Temp: 98.2 F (36.8 C) (10/13 0743) Temp src: Oral (10/13 0743) BP: 135/72 mmHg (10/13 1000) Pulse Rate: 97 (10/13 1000) Intake/Output from previous day: 10/12 0701 - 10/13 0700 In: 4000 [I.V.:2000; IV Piggyback:2000] Out: 300 [Urine:300] Intake/Output from this shift: Total I/O In: -  Out: 100 [Urine:100]  Labs:  Recent Labs  05/20/13 0122 05/20/13 0134 05/20/13 0830  WBC 12.6*  --  17.2*  HGB 17.2* 18.0* 14.2  PLT 264  --  208  CREATININE 1.00 1.00 0.79   Estimated Creatinine Clearance: 155.6 ml/min (by C-G formula based on Cr of 0.79). No results found for this basename: VANCOTROUGH, Leodis Binet, VANCORANDOM, GENTTROUGH, GENTPEAK, GENTRANDOM, TOBRATROUGH, TOBRAPEAK, TOBRARND, AMIKACINPEAK, AMIKACINTROU, AMIKACIN,  in the last 72 hours   Microbiology: Recent Results (from the past 720 hour(s))  MRSA PCR SCREENING     Status: None   Collection Time    05/20/13  7:47 AM      Result Value Range Status   MRSA by PCR NEGATIVE  NEGATIVE Final   Comment:            The GeneXpert MRSA Assay (FDA     approved for NASAL specimens     only), is one component of a     comprehensive MRSA colonization     surveillance program. It is not     intended to diagnose MRSA     infection nor to guide or     monitor treatment for     MRSA infections.    Medical History: Past Medical History  Diagnosis Date  . GERD (gastroesophageal reflux disease)     prn OTC  . Bimalleolar ankle fracture 08/07/2011    left  - with disruption syndesmosis  . ADHD (attention deficit hyperactivity disorder)     Medications:  Scheduled:  . imipenem-cilastatin  500 mg Intravenous Q8H  . sodium chloride  3 mL Intravenous  Q12H  . vancomycin  1,000 mg Intravenous Q8H   Assessment: 22 yr old male was seen at [facility] for nausea, vomiting, abdominal pain, fever, chills and diaphoresis. CT scan showed pneumomediastinum with no esophageal perforation or leak. Transferred to [facility] for treatment. Pt is NPO and receiving antibiotics Primaxin and Vancomycin. No indication for thoracic  surgical intervention at this time.  Goal of Therapy:  Vancomycin trough level 15-20 mcg/ml  Plan:  Start Vancomycin 1 Gm IV q8h. Levels when appropriate.  Primaxin 500 mg IV q8h.  Eugene Garnet 05/20/2013,11:03 AM

## 2013-05-20 NOTE — ED Notes (Signed)
Pt with intermitent episodes of violently shivering, lasts for minutes then resolves. Confirmed with [facility], episodes c/w behavior there at [facility].

## 2013-05-20 NOTE — ED Notes (Signed)
Pt denies trauma to chest or neck.  Pt complains of shortness of breath.  Oxygen sats at 100% on room air.

## 2013-05-20 NOTE — ED Notes (Addendum)
Pt not in room, pending arrival, enroute to [facility] via [facility].

## 2013-05-20 NOTE — Progress Notes (Signed)
Page placed to Allison,NP to discuss pt. Await return call.

## 2013-05-20 NOTE — ED Notes (Addendum)
Drenched gown changed in CT, pt stood and pivoted to CT table.

## 2013-05-20 NOTE — Progress Notes (Signed)
Pt transferring to med surg unit, room 6N29. Report was called 15 min ago and receiving RN will be Alona Bene. Patient's family with him, dad, and belongings sent with pt.

## 2013-05-20 NOTE — H&P (Signed)
[facility] History and Physical  Fernando Jimenez [facility] BJY:[id number] DOB: July 29, 1991 DOA: 05/20/2013  Referring physician: ER physician. PCP:  Duane Lope, MD   Chief Complaint: Nausea vomiting abdominal pain.  HPI: Fernando Jimenez is a 22 y.o. male presented to the ER at [facility] with nausea vomiting abdominal pain. Patient's symptoms started yesterday with multiple episodes of nausea vomiting and abdominal pain. Abdominal pain is in multiple areas. Dull aching comes and goes. Patient states that similar episodes that happen at least twice before in the last one month. Episodes have resolved spontaneously. Yesterday patient had a CT abdomen pelvis done which showed pneumomediastinum and patient was transferred to [facility] ER. In the ER patient had CT chest which showed pneumomediastinum and are followed by esophagogram all of which confirmed the pneumomediastinum but there was no active leak from the esophagus. On-call cardiothoracic surgeon Dr. Tyrone Sage has been consulted and will be seeing patient in consult. Patient has been started antibiotics and kept n.p.o. as requested by Dr. Tyrone Sage. Patient has been having some subjective feeling of fever chills. Denies any headache. Has some chest pain since yesterday after vomiting but presently chest pain-free. Denies any shortness of breath. Patient is not in acute distress.  Review of Systems: As presented in the history of presenting illness, rest negative.  Past Medical History  Diagnosis Date  . GERD (gastroesophageal reflux disease)     prn OTC  . Bimalleolar ankle fracture 08/07/2011    left  - with disruption syndesmosis  . ADHD (attention deficit hyperactivity disorder)    Past Surgical History  Procedure Laterality Date  . Orif ankle fracture  08/10/2011    Procedure: OPEN REDUCTION INTERNAL FIXATION (ORIF) ANKLE FRACTURE;  Surgeon: Mable Paris, MD;  Location:  SUR[facility]ERY [facility];  Service:  Orthopedics;  Laterality: Left;   Social History:  reports that he has been smoking.  He has never used smokeless tobacco. He reports that he drinks alcohol. He reports that he does not use illicit drugs. Where does patient live home. Can patient participate in ADLs? Yes.  No Known Allergies  Family History:  Family History  Problem Relation Age of Onset  . Diabetes Mellitus II Other       Prior to Admission medications   Medication Sig Start Date End Date Taking? Authorizing Provider  methylphenidate (CONCERTA) 54 MG CR tablet Take 54 mg by mouth daily as needed (only takes when needed for concentration).   Yes Historical Provider, MD  oxyCODONE-acetaminophen (PERCOCET) 5-325 MG per tablet Take 1 tablet by mouth every 4 (four) hours as needed.     Historical Provider, MD    Physical Exam: Filed Vitals:   05/20/13 0531 05/20/13 0532 05/20/13 0545 05/20/13 0600  BP:   109/86 121/81  Pulse: 76 94 109 96  Temp:      TempSrc:      Resp: 30 18 27 15   Height:      Weight:      SpO2: 100% 100% 100% 100%     General:  Well-developed and nourished.  Eyes: Anicteric no pallor.  ENT: No discharge from the ears eyes nose mouth.  Neck: No mass felt.  Cardiovascular: S1-S2 heard.  Respiratory: No rhonchi or crepitations.  Abdomen: Soft nontender bowel sounds present.  Skin: No rash.  Musculoskeletal: No edema.  Psychiatric: Appears normal.  Neurologic: Alert awake oriented to time place and person. Moves all extremities. He  Labs on Admission:  Basic Metabolic Panel:  Recent  Labs Lab 05/20/13 0122 05/20/13 0134  NA 139 139  K 4.5 4.4  CL 96 103  CO2 23  --   GLUCOSE 114* 111*  BUN 12 12  CREATININE 1.00 1.00  CALCIUM 11.4*  --    Liver Function Tests: No results found for this basename: AST, ALT, ALKPHOS, BILITOT, PROT, ALBUMIN,  in the last 168 hours No results found for this basename: LIPASE, AMYLASE,  in the last 168 hours No results found for this  basename: AMMONIA,  in the last 168 hours CBC:  Recent Labs Lab 05/20/13 0122 05/20/13 0134  WBC 12.6*  --   NEUTROABS 11.4*  --   HGB 17.2* 18.0*  HCT 48.1 53.0*  MCV 88.3  --   PLT 264  --    Cardiac Enzymes: No results found for this basename: CKTOTAL, CKMB, CKMBINDEX, TROPONINI,  in the last 168 hours  BNP (last 3 results) No results found for this basename: PROBNP,  in the last 8760 hours CBG: No results found for this basename: GLUCAP,  in the last 168 hours  Radiological Exams on Admission: Dg Chest 2 View  05/20/2013   CLINICAL DATA:  Chest pain.  EXAM: CHEST  2 VIEW  COMPARISON:  01/21/2009  FINDINGS: Extensive subcutaneous emphysema in the lower neck and upper left chest. There is pneumomediastinum with gas outlining the upper mediastinal more than lower mediastinal structures. Pneumomediastinum was seen on preceding abdominal CT. No definitive pneumothorax. No effusion, acute infiltrate, or pulmonary edema. No evidence of emphysema or airway obstruction. Normal heart size. No acute osseous trauma.  IMPRESSION: Pneumomediastinum and subcutaneous emphysema, preferentially in the upper chest.   Electronically Signed   By: Tiburcio Pea M.D.   On: 05/20/2013 02:41   Ct Chest W Contrast  05/20/2013   *RADIOLOGY REPORT*  Clinical Data: Pneumomediastinum, assess for esophageal rupture.  CT CHEST WITH CONTRAST  Technique:  Multidetector CT imaging of the chest was performed following the standard protocol during bolus administration of intravenous contrast.  Contrast: 75mL OMNIPAQUE IOHEXOL 300 MG/ML  SOLN  Comparison: Chest radiograph May 20, 2013 and CT of the abdomen and pelvis May 20, 2013 at 0200 hours.  Findings: Please note, there is little contrast within the thoracic structures.  Pneumomediastinum, bubbles of gas around the esophagus with no discrete rent.  Tracheobronchial tree appears patent midline.  No pneumothorax.  Gas dissects into the epidural space, is seen  along the left greater than right chest walls extending into the neck. No fluid collections within the neck. Small bubbles of gas may be intraluminal within the distal thoracic esophagus, axial 46/77.  The heart and pericardium are not suspicious.  No pleural effusions, focal consolidations, pulmonary nodules or masses.  Osseous structures are not suspicious, specifically no rib fracture.  IMPRESSION: Pneumomediastinum , with subcutaneous gas dissecting into the neck and epidural space.  No pneumothorax.  No fluid collections within the mediastinum.  A few bubbles of gas at the level of the distal thoracic esophagus which may in fact be intraluminal. Esophagram may be more sensitive for evaluation of small leak.   Original Report Authenticated By: Awilda Metro   Ct Abdomen Pelvis W Contrast  05/20/2013   *RADIOLOGY REPORT*  Clinical Data: Abdominal pain and emesis, evaluate for appendicitis.  CT ABDOMEN AND PELVIS WITH CONTRAST  Technique:  Multidetector CT imaging of the abdomen and pelvis was performed following the standard protocol during bolus administration of intravenous contrast.  Contrast: OMNIPAQUE IOHEXOL 300 MG/ML  SOLN, 1 OMNIPAQUE IOHEXOL 300 MG/ML  SOLN  Comparison: None available at time of study interpretation.  Findings: Limited view of the lung bases are clear. Partially imaged pneumomediastinum with air within the anterior posterior mediastinum, incompletely assessed.  The liver, spleen, pancreas, gallbladder and adrenal glands are normal in size, morphology and enhancement characteristics.  The stomach, small and large bowel are normal in course and caliber without definite wall thickening or inflammatory changes though sensitivity may be decreased by lack of enteric contrast.  Normal retrocecal appendix.  No free fluid nor free air.  The kidneys are well-located, demonstrating normal morphology, size and enhancement.  Mild right pelvicaliectasis.  2 mm nonobstructing left lower  pole renal calculus.  Urinary bladder is partially distended and appears normal.  Great vessels are normal in course and caliber.  No lymphadenopathy by CT size criteria.  Internal reproductive organs appear normal. The included soft tissues are unremarkable. Grade 1 L5 S1 anterolisthesis associated with bilateral chronic L5 pars interarticularis defects.  Scattered Schmorl's nodes within the lumbar spine.  IMPRESSION: Partially imaged pneumomediastinum.  2 mm nonobstructing left lower pole renal calculus with mild right renal pelvic celiac disease, no frank hydronephrosis.  Normal appendix.  Findings discussed with and reconfirmed by Dr. Rhunette Croft on May 20, 2013 at 0210 hours.   Original Report Authenticated By: Awilda Metro   Dg Esophagram W/water Sol Cm  05/20/2013   CLINICAL DATA:  Pneumomediastinum. Concern for esophageal rupture  EXAM: ESOPHOGRAM/BARIUM SWALLOW  TECHNIQUE: Single contrast examination was performed using isotonic water soluble contrast, followed by thin barium.  FINDINGS: Scout images over the neck and chest were first obtained. Pneumomediastinum was again seen, preferentially in the upper chest, extending into the lower neck and upper chest wall. The volume of pneumomediastinum is stable from prior chest radiography. Small sips of isotonic water soluble contrast was administered via straw in the upper right position. Due to patient tremulousness and repeated vomiting, positioning and coverage is suboptimal. No esophageal or pharyngeal leak or obstruction was identified. Next, sips of thin barium were administered. No extravasation seen. The caliber and contour of the pharynx and esophagus was normal, without evidence of obstruction. Postprocedure frontal chest radiograph was obtained, showing no effusion, contrast leakage, or pneumothorax.  IMPRESSION: 1. No evidence of esophageal or pharyngeal leak. 2. Stable volume of pneumomediastinum and subcutaneous emphysema compared to  earlier the same day.  : COMPARISON:  None.   Electronically Signed   By: Tiburcio Pea M.D.   On: 05/20/2013 05:35      Assessment/Plan Principal Problem:   Pneumomediastinum Active Problems:   Nausea & vomiting   Abdominal pain   1. Pneumomediastinum - patient has been kept n.p.o. and on empiric antibiotics. Will be closely monitored in step down. Further recommendations per Dr. Tyrone Sage. 2. Mild hypercalcemia - probably from dehydration. Recheck after hydration. 3. Nausea vomiting and abdominal discomfort - cause not clear. CT abdomen and pelvis does not show a clear cause. Patient states he does take marijuana and the last time was more than 2 months ago. Observe for now. Gentle hydration. 4. Tobacco abuse - cessation counseling requested. 5. History of ADHD - continue present medication once patient takes orally.    Code Status: Full code.  Family Communication: Patient's dad at the bedside.  Disposition Plan: Admit to inpatient.    Meia Emley N. [facility] Pager [phone].  If 7PM-7AM, please contact night-coverage [email or web address] Password [facility] 05/20/2013, 6:58 AM

## 2013-05-20 NOTE — Consult Note (Signed)
Reason for Consult: Abdominal pain nausea vomiting Referring Physician: Hospital team  Fernando Jimenez is an 22 y.o. male.  HPI: Patient with a one-month history of episodic abdominal pain and nausea vomiting and supposedly asymptomatic in between spells and was admitted with pneumonia mediastinum but negative Gastrografin swallow who has continued to have some lower abdominal cramps and some fever and increased white count and we are consulted for further workup and plans and his case was discussed with both he and his mother who actually did have a short viral gastroenteritis a week ago and they live together and the patient denies drug use but does smoke some and does drink some but not to heavy and has no sick contacts and no previous GI issues except occasional reflux and his family history is pertinent for reflux and a grandfather recently diagnosed with appendiceal cancer and although a kidney stone showed up on CT CT and labs were nondiagnostic and he has not seen any blood in his bowels or urine and currently is hungry and wants to eat which we discussed Past Medical History  Diagnosis Date  . GERD (gastroesophageal reflux disease)     prn OTC  . Bimalleolar ankle fracture 08/07/2011    left  - with disruption syndesmosis  . ADHD (attention deficit hyperactivity disorder)     Past Surgical History  Procedure Laterality Date  . Orif ankle fracture  08/10/2011    Procedure: OPEN REDUCTION INTERNAL FIXATION (ORIF) ANKLE FRACTURE;  Surgeon: Mable Paris, MD;  Location:  SUR[facility]ERY [facility];  Service: Orthopedics;  Laterality: Left;    Family History  Problem Relation Age of Onset  . Diabetes Mellitus II Other     Social History:  reports that he has been smoking.  He has never used smokeless tobacco. He reports that he drinks alcohol. He reports that he does not use illicit drugs.  Allergies: No Known Allergies  Medications: I have reviewed the patient's current  medications.  Results for orders placed during the hospital encounter of 05/20/13 (from the past 48 hour(s))  CBC WITH DIFFERENTIAL     Status: Abnormal   Collection Time    05/20/13  1:22 AM      Result Value Range   WBC 12.6 (*) 4.0 - 10.5 K/uL   RBC 5.45  4.22 - 5.81 MIL/uL   Hemoglobin 17.2 (*) 13.0 - 17.0 g/dL   HCT 16.1  09.6 - 04.5 %   MCV 88.3  78.0 - 100.0 fL   MCH 31.6  26.0 - 34.0 pg   MCHC 35.8  30.0 - 36.0 g/dL   RDW 40.9  81.1 - 91.4 %   Platelets 264  150 - 400 K/uL   Neutrophils Relative % 90 (*) 43 - 77 %   Neutro Abs 11.4 (*) 1.7 - 7.7 K/uL   Lymphocytes Relative 7 (*) 12 - 46 %   Lymphs Abs 0.8  0.7 - 4.0 K/uL   Monocytes Relative 3  3 - 12 %   Monocytes Absolute 0.4  0.1 - 1.0 K/uL   Eosinophils Relative 0  0 - 5 %   Eosinophils Absolute 0.0  0.0 - 0.7 K/uL   Basophils Relative 0  0 - 1 %   Basophils Absolute 0.0  0.0 - 0.1 K/uL  BASIC METABOLIC PANEL     Status: Abnormal   Collection Time    05/20/13  1:22 AM      Result Value Range   Sodium 139  135 - 145 mEq/L   Potassium 4.5  3.5 - 5.1 mEq/L   Chloride 96  96 - 112 mEq/L   CO2 23  19 - 32 mEq/L   Glucose, Bld 114 (*) 70 - 99 mg/dL   BUN 12  6 - 23 mg/dL   Creatinine, Ser 1.61  0.50 - 1.35 mg/dL   Calcium 09.6 (*) 8.4 - 10.5 mg/dL   GFR calc non Af Amer >90  >90 mL/min   GFR calc Af Amer >90  >90 mL/min   Comment: (NOTE)     The eGFR has been calculated using the CKD EPI equation.     This calculation has not been validated in all clinical situations.     eGFR's persistently <90 mL/min signify possible Chronic Kidney     Disease.  POCT I-STAT, CHEM 8     Status: Abnormal   Collection Time    05/20/13  1:34 AM      Result Value Range   Sodium 139  135 - 145 mEq/L   Potassium 4.4  3.5 - 5.1 mEq/L   Chloride 103  96 - 112 mEq/L   BUN 12  6 - 23 mg/dL   Creatinine, Ser 0.45  0.50 - 1.35 mg/dL   Glucose, Bld 409 (*) 70 - 99 mg/dL   Calcium, Ion 8.11  9.14 - 1.23 mmol/L   TCO2 22  0 - 100  mmol/L   Hemoglobin 18.0 (*) 13.0 - 17.0 g/dL   HCT 78.2 (*) 95.6 - 21.3 %  CG4 I-STAT (LACTIC ACID)     Status: Abnormal   Collection Time    05/20/13  1:35 AM      Result Value Range   Lactic Acid, Venous 3.69 (*) 0.5 - 2.2 mmol/L  URINALYSIS, ROUTINE W REFLEX MICROSCOPIC     Status: Abnormal   Collection Time    05/20/13  1:43 AM      Result Value Range   Color, Urine AMBER (*) YELLOW   Comment: BIOCHEMICALS MAY BE AFFECTED BY COLOR   APPearance CLEAR  CLEAR   Specific Gravity, Urine 1.034 (*) 1.005 - 1.030   pH 5.5  5.0 - 8.0   Glucose, UA NEGATIVE  NEGATIVE mg/dL   Hgb urine dipstick TRACE (*) NEGATIVE   Bilirubin Urine SMALL (*) NEGATIVE   Ketones, ur >80 (*) NEGATIVE mg/dL   Protein, ur 086 (*) NEGATIVE mg/dL   Urobilinogen, UA 0.2  0.0 - 1.0 mg/dL   Nitrite NEGATIVE  NEGATIVE   Leukocytes, UA NEGATIVE  NEGATIVE  URINE RAPID DRUG SCREEN (HOSP PERFORMED)     Status: None   Collection Time    05/20/13  1:43 AM      Result Value Range   Opiates NONE DETECTED  NONE DETECTED   Cocaine NONE DETECTED  NONE DETECTED   Benzodiazepines NONE DETECTED  NONE DETECTED   Amphetamines NONE DETECTED  NONE DETECTED   Tetrahydrocannabinol NONE DETECTED  NONE DETECTED   Barbiturates NONE DETECTED  NONE DETECTED   Comment:            DRUG SCREEN FOR MEDICAL PURPOSES     ONLY.  IF CONFIRMATION IS NEEDED     FOR ANY PURPOSE, NOTIFY LAB     WITHIN 5 DAYS.                LOWEST DETECTABLE LIMITS     FOR URINE DRUG SCREEN     Drug Class  Cutoff (ng/mL)     Amphetamine      1000     Barbiturate      200     Benzodiazepine   200     Tricyclics       300     Opiates          300     Cocaine          300     THC              50  URINE MICROSCOPIC-ADD ON     Status: Abnormal   Collection Time    05/20/13  1:43 AM      Result Value Range   Squamous Epithelial / LPF FEW (*) RARE   WBC, UA 7-10  <3 WBC/hpf   RBC / HPF 0-2  <3 RBC/hpf   Bacteria, UA FEW (*) RARE   Urine-Other  MUCOUS PRESENT     Comment: AMORPHOUS URATES/PHOSPHATES  POCT I-STAT 3, BLOOD GAS (G3P V)     Status: Abnormal   Collection Time    05/20/13  2:45 AM      Result Value Range   pH, Ven 7.355 (*) 7.250 - 7.300   pCO2, Ven 36.2 (*) 45.0 - 50.0 mmHg   pO2, Ven 38.0  30.0 - 45.0 mmHg   Bicarbonate 20.2  20.0 - 24.0 mEq/L   TCO2 21  0 - 100 mmol/L   O2 Saturation 70.0     Acid-base deficit 5.0 (*) 0.0 - 2.0 mmol/L   Patient temperature 98.6 F     Collection site BRACHIAL ARTERY     Drawn by RT     Sample type VENOUS     Comment NOTIFIED PHYSICIAN    URINALYSIS, ROUTINE W REFLEX MICROSCOPIC     Status: Abnormal   Collection Time    05/20/13  4:42 AM      Result Value Range   Color, Urine YELLOW  YELLOW   APPearance CLEAR  CLEAR   Specific Gravity, Urine 1.015  1.005 - 1.030   pH 5.5  5.0 - 8.0   Glucose, UA NEGATIVE  NEGATIVE mg/dL   Hgb urine dipstick NEGATIVE  NEGATIVE   Bilirubin Urine NEGATIVE  NEGATIVE   Ketones, ur >80 (*) NEGATIVE mg/dL   Protein, ur NEGATIVE  NEGATIVE mg/dL   Urobilinogen, UA 0.2  0.0 - 1.0 mg/dL   Nitrite NEGATIVE  NEGATIVE   Leukocytes, UA NEGATIVE  NEGATIVE   Comment: MICROSCOPIC NOT DONE ON URINES WITH NEGATIVE PROTEIN, BLOOD, LEUKOCYTES, NITRITE, OR GLUCOSE <1000 mg/dL.  MRSA PCR SCREENING     Status: None   Collection Time    05/20/13  7:47 AM      Result Value Range   MRSA by PCR NEGATIVE  NEGATIVE   Comment:            The GeneXpert MRSA Assay (FDA     approved for NASAL specimens     only), is one component of a     comprehensive MRSA colonization     surveillance program. It is not     intended to diagnose MRSA     infection nor to guide or     monitor treatment for     MRSA infections.  GLUCOSE, CAPILLARY     Status: None   Collection Time    05/20/13  8:21 AM      Result Value Range   Glucose-Capillary 93  70 - 99  mg/dL  TROPONIN I     Status: None   Collection Time    05/20/13  8:30 AM      Result Value Range   Troponin I <0.30   <0.30 ng/mL   Comment:            Due to the release kinetics of cTnI,     a negative result within the first hours     of the onset of symptoms does not rule out     myocardial infarction with certainty.     If myocardial infarction is still suspected,     repeat the test at appropriate intervals.  COMPREHENSIVE METABOLIC PANEL     Status: Abnormal   Collection Time    05/20/13  8:30 AM      Result Value Range   Sodium 137  135 - 145 mEq/L   Potassium 3.9  3.5 - 5.1 mEq/L   Chloride 102  96 - 112 mEq/L   CO2 18 (*) 19 - 32 mEq/L   Glucose, Bld 87  70 - 99 mg/dL   BUN 7  6 - 23 mg/dL   Creatinine, Ser 4.09  0.50 - 1.35 mg/dL   Calcium 9.1  8.4 - 81.1 mg/dL   Total Protein 7.3  6.0 - 8.3 g/dL   Albumin 4.5  3.5 - 5.2 g/dL   AST 20  0 - 37 U/L   ALT 11  0 - 53 U/L   Alkaline Phosphatase 83  39 - 117 U/L   Total Bilirubin 0.6  0.3 - 1.2 mg/dL   GFR calc non Af Amer >90  >90 mL/min   GFR calc Af Amer >90  >90 mL/min   Comment: (NOTE)     The eGFR has been calculated using the CKD EPI equation.     This calculation has not been validated in all clinical situations.     eGFR's persistently <90 mL/min signify possible Chronic Kidney     Disease.  CBC WITH DIFFERENTIAL     Status: Abnormal   Collection Time    05/20/13  8:30 AM      Result Value Range   WBC 17.2 (*) 4.0 - 10.5 K/uL   RBC 4.54  4.22 - 5.81 MIL/uL   Hemoglobin 14.2  13.0 - 17.0 g/dL   HCT 91.4  78.2 - 95.6 %   MCV 89.2  78.0 - 100.0 fL   MCH 31.3  26.0 - 34.0 pg   MCHC 35.1  30.0 - 36.0 g/dL   RDW 21.3  08.6 - 57.8 %   Platelets 208  150 - 400 K/uL   Neutrophils Relative % 87 (*) 43 - 77 %   Neutro Abs 15.0 (*) 1.7 - 7.7 K/uL   Lymphocytes Relative 8 (*) 12 - 46 %   Lymphs Abs 1.4  0.7 - 4.0 K/uL   Monocytes Relative 4  3 - 12 %   Monocytes Absolute 0.7  0.1 - 1.0 K/uL   Eosinophils Relative 0  0 - 5 %   Eosinophils Absolute 0.0  0.0 - 0.7 K/uL   Basophils Relative 0  0 - 1 %   Basophils Absolute 0.0   0.0 - 0.1 K/uL  LACTIC ACID, PLASMA     Status: None   Collection Time    05/20/13  8:30 AM      Result Value Range   Lactic Acid, Venous 1.2  0.5 - 2.2 mmol/L    Dg Chest 2 View  05/20/2013   CLINICAL DATA:  Chest pain.  EXAM: CHEST  2 VIEW  COMPARISON:  01/21/2009  FINDINGS: Extensive subcutaneous emphysema in the lower neck and upper left chest. There is pneumomediastinum with gas outlining the upper mediastinal more than lower mediastinal structures. Pneumomediastinum was seen on preceding abdominal CT. No definitive pneumothorax. No effusion, acute infiltrate, or pulmonary edema. No evidence of emphysema or airway obstruction. Normal heart size. No acute osseous trauma.  IMPRESSION: Pneumomediastinum and subcutaneous emphysema, preferentially in the upper chest.   Electronically Signed   By: Tiburcio Pea M.D.   On: 05/20/2013 02:41   Ct Chest W Contrast  05/20/2013   *RADIOLOGY REPORT*  Clinical Data: Pneumomediastinum, assess for esophageal rupture.  CT CHEST WITH CONTRAST  Technique:  Multidetector CT imaging of the chest was performed following the standard protocol during bolus administration of intravenous contrast.  Contrast: 75mL OMNIPAQUE IOHEXOL 300 MG/ML  SOLN  Comparison: Chest radiograph May 20, 2013 and CT of the abdomen and pelvis May 20, 2013 at 0200 hours.  Findings: Please note, there is little contrast within the thoracic structures.  Pneumomediastinum, bubbles of gas around the esophagus with no discrete rent.  Tracheobronchial tree appears patent midline.  No pneumothorax.  Gas dissects into the epidural space, is seen along the left greater than right chest walls extending into the neck. No fluid collections within the neck. Small bubbles of gas may be intraluminal within the distal thoracic esophagus, axial 46/77.  The heart and pericardium are not suspicious.  No pleural effusions, focal consolidations, pulmonary nodules or masses.  Osseous structures are not  suspicious, specifically no rib fracture.  IMPRESSION: Pneumomediastinum , with subcutaneous gas dissecting into the neck and epidural space.  No pneumothorax.  No fluid collections within the mediastinum.  A few bubbles of gas at the level of the distal thoracic esophagus which may in fact be intraluminal. Esophagram may be more sensitive for evaluation of small leak.   Original Report Authenticated By: Awilda Metro   Ct Abdomen Pelvis W Contrast  05/20/2013   *RADIOLOGY REPORT*  Clinical Data: Abdominal pain and emesis, evaluate for appendicitis.  CT ABDOMEN AND PELVIS WITH CONTRAST  Technique:  Multidetector CT imaging of the abdomen and pelvis was performed following the standard protocol during bolus administration of intravenous contrast.  Contrast: OMNIPAQUE IOHEXOL 300 MG/ML  SOLN, 1 OMNIPAQUE IOHEXOL 300 MG/ML  SOLN  Comparison: None available at time of study interpretation.  Findings: Limited view of the lung bases are clear. Partially imaged pneumomediastinum with air within the anterior posterior mediastinum, incompletely assessed.  The liver, spleen, pancreas, gallbladder and adrenal glands are normal in size, morphology and enhancement characteristics.  The stomach, small and large bowel are normal in course and caliber without definite wall thickening or inflammatory changes though sensitivity may be decreased by lack of enteric contrast.  Normal retrocecal appendix.  No free fluid nor free air.  The kidneys are well-located, demonstrating normal morphology, size and enhancement.  Mild right pelvicaliectasis.  2 mm nonobstructing left lower pole renal calculus.  Urinary bladder is partially distended and appears normal.  Great vessels are normal in course and caliber.  No lymphadenopathy by CT size criteria.  Internal reproductive organs appear normal. The included soft tissues are unremarkable. Grade 1 L5 S1 anterolisthesis associated with bilateral chronic L5 pars interarticularis  defects.  Scattered Schmorl's nodes within the lumbar spine.  IMPRESSION: Partially imaged pneumomediastinum.  2 mm nonobstructing left lower pole renal calculus with mild  right renal pelvic celiac disease, no frank hydronephrosis.  Normal appendix.  Findings discussed with and reconfirmed by Dr. Rhunette Croft on May 20, 2013 at 0210 hours.   Original Report Authenticated By: Awilda Metro   Dg Esophagram W/water Sol Cm  05/20/2013   CLINICAL DATA:  Pneumomediastinum. Concern for esophageal rupture  EXAM: ESOPHOGRAM/BARIUM SWALLOW  TECHNIQUE: Single contrast examination was performed using isotonic water soluble contrast, followed by thin barium.  FINDINGS: Scout images over the neck and chest were first obtained. Pneumomediastinum was again seen, preferentially in the upper chest, extending into the lower neck and upper chest wall. The volume of pneumomediastinum is stable from prior chest radiography. Small sips of isotonic water soluble contrast was administered via straw in the upper right position. Due to patient tremulousness and repeated vomiting, positioning and coverage is suboptimal. No esophageal or pharyngeal leak or obstruction was identified. Next, sips of thin barium were administered. No extravasation seen. The caliber and contour of the pharynx and esophagus was normal, without evidence of obstruction. Postprocedure frontal chest radiograph was obtained, showing no effusion, contrast leakage, or pneumothorax.  IMPRESSION: 1. No evidence of esophageal or pharyngeal leak. 2. Stable volume of pneumomediastinum and subcutaneous emphysema compared to earlier the same day.  : COMPARISON:  None.   Electronically Signed   By: Tiburcio Pea M.D.   On: 05/20/2013 05:35    ROS negative except above Blood pressure 135/72, pulse 97, temperature 98.2 F (36.8 C), temperature source Oral, resp. rate 19, height 5\' 11"  (1.803 m), weight 78.1 kg (172 lb 2.9 oz), SpO2 100.00%. Physical Exam vital signs  stable afebrile he does have slightly abnormal affect with increased nervousness but exam is pertinent for her abdomen being soft nontender good bowel sound CT and labs reviewed as was Gastrografin swallow Assessment/Plan:pneumomediastinum and periodic abdominal pain nausea vomiting questionable etiology Plan: Keep n.p.o. for probably 2 more days and then clear liquids and questionable even repeat Gastrografin swallow first and continue pain medicine and antibiotics and await tomorrow's white count and questionably his pain is just due to kidney stone and I will check on tomorrow and call me sooner when necessary [facility] E 05/20/2013, 12:28 PM

## 2013-05-20 NOTE — ED Notes (Signed)
Chem 8 and LAC ran by this RT and hand delivered to Dr. Rhunette Croft.

## 2013-05-20 NOTE — ED Notes (Signed)
Shown results lab results [email or web address]  LATIC ACTID

## 2013-05-20 NOTE — ED Notes (Addendum)
Arrives to room via [facility], alert, interactive, diaphoretic, actively vomiting, speech clear, interactive. IVF and abx infusing via pump, IV sites x2 unremarkable.

## 2013-05-20 NOTE — ED Notes (Signed)
Upon entering the room.  Pt yelling and writhing in pain. Pt profusely sweating.  Pt hyperventilating reporting that abdominal cramping is severe.  Reports cramps come and go and last about to 1 hour.

## 2013-05-20 NOTE — ED Notes (Signed)
Dr. Norlene Campbell into room to update pt & father.

## 2013-05-20 NOTE — ED Notes (Addendum)
Lab at [facility]. Urine sent to lab. Preparing to go to fluoro/xray.no changes, pt alert, NAD< calm, resps e/u, no vomiting at this time, denies nausea. Rates pain 2/10.

## 2013-05-20 NOTE — ED Notes (Signed)
Pt experiencing extreme sweats and hyperventilating. Encouraged to slow breathing down.

## 2013-05-20 NOTE — ED Notes (Signed)
Back from esophogram, no changes, amiable, alert, NAD, calm, ambulatory to b/r with steady gait, father at [facility], pending results. VSS.

## 2013-05-20 NOTE — Progress Notes (Signed)
Utilization Review Completed.Fernando Jimenez T10/13/2014  

## 2013-05-20 NOTE — ED Notes (Signed)
Pt report given to [facility] 

## 2013-05-20 NOTE — ED Provider Notes (Signed)
CSN: [id number]     Arrival date & time 05/20/13  0047 History   First MD Initiated Contact with Patient 05/20/13 0053     Chief Complaint  Patient presents with  . Emesis   (Consider location/radiation/quality/duration/timing/severity/associated sxs/prior Treatment) HPI Comments: PT comes in with cc of abd pain and emesis. No medical hx, abd surgical hx. Pt reports having intermittent abd pain, similar to current one for the past month. The pain is diffuse, sharp abd pain, left sided, non radiating. Pt gets nauseated. This afternoon, he started having pain again, and it has worsened. Pt is nauseated and has had emesis, no diarrhea, no uti like sx, no bloody urine. Pt has no hx of renal stones, no testicular pain.  Patient is a 22 y.o. male presenting with vomiting. The history is provided by the patient.  Emesis Associated symptoms: abdominal pain and chills     Past Medical History  Diagnosis Date  . GERD (gastroesophageal reflux disease)     prn OTC  . Bimalleolar ankle fracture 08/07/2011    left  - with disruption syndesmosis  . ADHD (attention deficit hyperactivity disorder)    Past Surgical History  Procedure Laterality Date  . Orif ankle fracture  08/10/2011    Procedure: OPEN REDUCTION INTERNAL FIXATION (ORIF) ANKLE FRACTURE;  Surgeon: Mable Paris, MD;  Location: [facility];  Service: Orthopedics;  Laterality: Left;   No family history on file. History  Substance Use Topics  . Smoking status: Current Every Day Smoker  . Smokeless tobacco: Never Used  . Alcohol Use: Yes     Comment: once weekly     Review of Systems  Constitutional: Positive for chills and activity change. Negative for appetite change.  Respiratory: Positive for shortness of breath. Negative for cough and wheezing.   Cardiovascular: Negative for chest pain.  Gastrointestinal: Positive for nausea, vomiting and abdominal pain.  Genitourinary: Negative for dysuria.  Skin:  Negative for wound.  Hematological: Does not bruise/bleed easily.    Allergies  Review of patient's allergies indicates no known allergies.  Home Medications   Current Outpatient Rx  Name  Route  Sig  Dispense  Refill  . methylphenidate (CONCERTA) 54 MG CR tablet   Oral   Take 54 mg by mouth every morning.         Marland Kitchen oxyCODONE-acetaminophen (PERCOCET) 5-325 MG per tablet   Oral   Take 1 tablet by mouth every 4 (four) hours as needed.            BP 141/79  Pulse 91  Temp(Src) 97.6 F (36.4 C) (Oral)  Resp 18  Ht 5\' 11"  (1.803 m)  Wt 175 lb (79.379 kg)  BMI 24.42 kg/m2  SpO2 98% Physical Exam  Nursing note and vitals reviewed. Constitutional: He is oriented to person, place, and time. He appears well-developed.  HENT:  Head: Normocephalic and atraumatic.  Eyes: Conjunctivae and EOM are normal. Pupils are equal, round, and reactive to light.  Neck: Normal range of motion. Neck supple.  Soft tissue crepitus in the neck - bilaterally  Cardiovascular: Normal rate and regular rhythm.   Pulmonary/Chest: Effort normal and breath sounds normal.  Abdominal: Soft. Bowel sounds are normal. He exhibits no distension. There is tenderness. There is guarding. There is no rebound.  Diffuse abdominal tenderness, with guarding over mcburneys.  Genitourinary:  Scrotal exam shows no hernia, no rash, testicles are free moving and there is no mass appreciated.  Neurological: He is alert and  oriented to person, place, and time.  Skin: Skin is warm. He is diaphoretic.    ED Course  Procedures (including critical care time) Labs Review Labs Reviewed  CBC WITH DIFFERENTIAL - Abnormal; Notable for the following:    WBC 12.6 (*)    Hemoglobin 17.2 (*)    Neutrophils Relative % 90 (*)    Neutro Abs 11.4 (*)    Lymphocytes Relative 7 (*)    All other components within normal limits  BASIC METABOLIC PANEL - Abnormal; Notable for the following:    Glucose, Bld 114 (*)    Calcium 11.4 (*)     All other components within normal limits  POCT I-STAT, CHEM 8 - Abnormal; Notable for the following:    Glucose, Bld 111 (*)    Hemoglobin 18.0 (*)    HCT 53.0 (*)    All other components within normal limits  CG4 I-STAT (LACTIC ACID) - Abnormal; Notable for the following:    Lactic Acid, Venous 3.69 (*)    All other components within normal limits  URINE CULTURE  URINALYSIS, ROUTINE W REFLEX MICROSCOPIC  URINE RAPID DRUG SCREEN (HOSP PERFORMED)   Imaging Review No results found.  EKG Interpretation   None       MDM  No diagnosis found.   Pt comes in with cc of emesis and diffuse abd pain. No medical hx., no abd surgical hx. Mo hx of renal stones, gastritis. Pt was noted to be diaphoretic, with normal vitals signs, and diffuse abd tenderness with mcburneys. GU exam, including testicular exam was normal. Initial impression was perforated appendicitis/viscous, renal stones.  Labs were mostly unremarkable, lactate was > 3. CT abdomen with contrast revealed no acute abdominal pathology, however, there was an incidental finding of pneumomediastinum, confirmed with repeat exam of the neck and CXR. Repeat hx still reveals no trauma. Pt has had 4 episodes of non bloody emesis so far.   Spoke with Dr. Lowella Fairy, Thoracic Surgery over at [facility] and he recommends  1) Ct chest with contrast - gastrografin 2) Esophagogram - water soluble contrast.  Dr. Norlene Campbell at [facility] ED accepting the patient, and will be ordering the further diagnostic workup, i have spoken with her and we have informed the charge RN as well.  I have alerted the Radiologist as well about the patient.  Pt is now more tachycardic. Continues to have diaphoresis and rigors, with intermittent severe cramping abd pain.  Blood cultures ordered, Unasyn started in the ED.  Stable for transfer, via [facility] traffic  Working diagnosis: Pneumomediastinum with possible underlying Esophageal tear/  [place] Evolving Sepsis       Derwood Kaplan, MD 05/20/13 [phone]

## 2013-05-20 NOTE — ED Notes (Addendum)
Back from CT, no changes.  

## 2013-05-21 DIAGNOSIS — J982 Interstitial emphysema: Secondary | ICD-10-CM

## 2013-05-21 LAB — URINE CULTURE
Colony Count: 9000
Special Requests: NORMAL

## 2013-05-21 LAB — BASIC METABOLIC PANEL
BUN: 4 mg/dL — ABNORMAL LOW (ref 6–23)
CO2: 21 mEq/L (ref 19–32)
Creatinine, Ser: 0.83 mg/dL (ref 0.50–1.35)
GFR calc Af Amer: >90 mL/min (ref >90)
GFR calc non Af Amer: >90 mL/min (ref >90)
Potassium: 3.6 mEq/L (ref 3.5–5.1)
Sodium: 139 mEq/L (ref 135–145)

## 2013-05-21 LAB — CBC
HCT: 42.3 % (ref 39.0–52.0)
MCH: 31.1 pg (ref 26.0–34.0)
MCHC: 34.8 g/dL (ref 30.0–36.0)
RBC: 4.72 MIL/uL (ref 4.22–5.81)
RDW: 12.7 % (ref 11.5–15.5)

## 2013-05-21 LAB — GLUCOSE, CAPILLARY

## 2013-05-21 MED ORDER — MORPHINE SULFATE 2 MG/ML IJ SOLN
1.0000 mg | INTRAMUSCULAR | Status: DC | PRN
  Administered 2013-05-21 (×2): 2 mg via INTRAVENOUS
  Filled 2013-05-21 (×2): qty 1

## 2013-05-21 MED ORDER — LORAZEPAM 2 MG/ML IJ SOLN
1.0000 mg | Freq: Once | INTRAMUSCULAR | Status: AC
  Administered 2013-05-21: 1 mg via INTRAVENOUS
  Filled 2013-05-21: qty 1

## 2013-05-21 MED ORDER — LORAZEPAM 2 MG/ML IJ SOLN
INTRAMUSCULAR | Status: AC
  Filled 2013-05-21: qty 2

## 2013-05-21 MED ORDER — LORAZEPAM 2 MG/ML IJ SOLN
3.0000 mg | Freq: Once | INTRAMUSCULAR | Status: AC
  Administered 2013-05-21: 1 mg via INTRAVENOUS

## 2013-05-21 MED ORDER — LORAZEPAM 2 MG/ML IJ SOLN
2.0000 mg | INTRAMUSCULAR | Status: DC | PRN
  Administered 2013-05-21 – 2013-05-22 (×4): 2 mg via INTRAVENOUS
  Filled 2013-05-21 (×3): qty 1

## 2013-05-21 NOTE — Clinical Social Work Note (Signed)
CSW met with pt at bedside regarding substance abuse of "spice." Pt's mother was present at bedside when CSW entered room. Pt's mother was understanding and agreeable to CSW speaking to pt alone. Pt stated that he was feeling "ok" today. CSW explained role of CSW coming to speak with pt to pt. Pt was receptive to speaking with CSW. Pt stated that he uses "spice" recreationally and has not used it in months. Pt noted that he uses "spice" by smoking it through a "bong." Pt denied any use of other drugs. CSW asked if pt uses "spice" with friends or as a coping mechanism. Pt stated that he uses "spice" for both reasons. Pt further explained that he has recently "failed out" of college at [employer] where he was studying psychology and is now working a full-time job. Pt stated that he is also having family issues with his grandparents being diagnosed with cancer. CSW and pt discussed how life can bring obstacles, but we can still control our lives. Pt stated that he does not think he could "ever graduate from a college." CSW and pt discussed pt's career goals in life. Pt noted that he enjoyed drama classes, but knew he could not make a career of it, and he also had thought about being a IT sales professional. CSW spoke to pt about pt speaking with a counselor regarding his family, social, and academic issues. Pt stated that he had "never considered it, but don't know if it will work till I try it." CSW provided pt with information regarding local behavioral health for outpatient services. CSW informed MD of information above.  CSW to sign off. Please re consult if needed.  Darlyn Chamber, LCSWA 6N/3S Clinical Social Worker [phone]

## 2013-05-21 NOTE — Progress Notes (Signed)
[facility] PROGRESS NOTE  Fernando Jimenez [facility] ZOX:[id number] DOB: 07-16-1991 DOA: 05/20/2013 PCP:  Duane Lope, MD  Assessment/Plan: 1. Pneumomediastinum. Patient having multiple episodes of nausea vomiting and retching over the past month. CT scan of chest performed overnight showing pneumonia mediastinum with subacute is gas dissecting into the neck and epidural space. Cardiothoracic surgery has been consulted, will continue conservative management. Followup on repeat chest x-ray in the morning. Will DC antibiotics. 2. Nausea/vomiting. Unclear etiology. With his agitation, hallucinations and possible psychosis I discussed with his parents the possibility of current presentation being drug-related. Apparently had he has been using SPICE (smoking sympathetic marijuana) where last use may have been just 3 days ago. Literature search revealing that this drug has been associated with agitation, nausea/vomiting, hallucinations, paranoia. GI has been consulted. CT scan of abdomen and pelvis not revealing an acute intra-abdominal pathology. 3. Agitation/hallucinations. Patient becoming quite agitated, as family have reported noting hallucinations in him. This could possibly be drug related, as his parents are aware of him using synthetic marijuana. His father believes that the last time he used this drug was possibly 3 days ago. Will provide when necessary IV Ativan. Supportive care.  4. Renal calculi. Unlikely that this is the cause of his pain. CT scan report reported very tiny nonobstructing calculi. 5. Diet: Clear liquid diet with plans to advance tomorrow.  Code Status: Full code Family Communication: I discussed case with Donah Driver present at bedside Disposition Plan: Provide when necessary Ativan for agitation, place him on clears, advance his diet tomorrow as tolerated   Consultants:  Cardiothoracic surgery  Gastroenterology   HPI/Subjective:  During my encounter patient is agitated,  having rapid pressured speech, seemingly uncontrolled body movements at times, his mother at bedside tells me that he has had hallucinations. Patient was administered IV Ativan. Both parents present at bedside update on patient's condition.  Objective: Filed Vitals:   05/21/13 1337  BP: 123/71  Pulse: 77  Temp: 98.2 F (36.8 C)  Resp: 16    Intake/Output Summary (Last 24 hours) at 05/21/13 1447 Last data filed at 05/21/13 1300  Gross per 24 hour  Intake 449.58 ml  Output   1150 ml  Net -700.42 ml   Filed Weights   05/20/13 0055 05/20/13 0743  Weight: 79.379 kg (175 lb) 78.1 kg (172 lb 2.9 oz)    Exam:   General:  Agitated, anxious, in moderate distress  Cardiovascular: Tachycardic regular rate rhythm normal S1-S2  Respiratory: Lungs are clear to auscultation bilaterally no wheezing rhonchi overall  Abdomen: Soft mild generalized tenderness to palpation  Musculoskeletal: Present range of motion of all extremities   Data Reviewed: Basic Metabolic Panel:  Recent Labs Lab 05/20/13 0122 05/20/13 0134 05/20/13 0830 05/21/13 0755  NA 139 139 137 139  K 4.5 4.4 3.9 3.6  CL 96 103 102 103  CO2 23  --  18* 21  GLUCOSE 114* 111* 87 83  BUN 12 12 7  4*  CREATININE 1.00 1.00 0.79 0.83  CALCIUM 11.4*  --  9.1 9.3   Liver Function Tests:  Recent Labs Lab 05/20/13 0830  AST 20  ALT 11  ALKPHOS 83  BILITOT 0.6  PROT 7.3  ALBUMIN 4.5   No results found for this basename: LIPASE, AMYLASE,  in the last 168 hours No results found for this basename: AMMONIA,  in the last 168 hours CBC:  Recent Labs Lab 05/20/13 0122 05/20/13 0134 05/20/13 0830 05/21/13 0755  WBC 12.6*  --  17.2*  10.9*  NEUTROABS 11.4*  --  15.0*  --   HGB 17.2* 18.0* 14.2 14.7  HCT 48.1 53.0* 40.5 42.3  MCV 88.3  --  89.2 89.6  PLT 264  --  208 229   Cardiac Enzymes:  Recent Labs Lab 05/20/13 0830  TROPONINI <0.30   BNP (last 3 results) No results found for this basename: PROBNP,   in the last 8760 hours CBG:  Recent Labs Lab 05/20/13 1554 05/20/13 1743 05/21/13 0011 05/21/13 0638 05/21/13 1140  GLUCAP 90 90 76 97 86    Recent Results (from the past 240 hour(s))  URINE CULTURE     Status: None   Collection Time    05/20/13  1:43 AM      Result Value Range Status   Specimen Description URINE, CLEAN CATCH   Final   Special Requests Normal   Final   Culture  Setup Time     Final   Value: 05/20/2013 08:14     Performed at [facility] Count     Final   Value: 9,000 COLONIES/ML     Performed at [facility]   Culture     Final   Value: INSIGNIFICANT GROWTH     Performed at [facility]   Report Status 05/21/2013 FINAL   Final  CULTURE, BLOOD (ROUTINE X 2)     Status: None   Collection Time    05/20/13  2:25 AM      Result Value Range Status   Specimen Description BLOOD LEFT ANTECUBITAL   Final   Special Requests     Final   Value: BOTTLES DRAWN AEROBIC AND ANAEROBIC AER 5cc ANA 5cc   Culture  Setup Time     Final   Value: 05/20/2013 06:46     Performed at [facility]   Culture     Final   Value:        BLOOD CULTURE RECEIVED NO GROWTH TO DATE CULTURE WILL BE HELD FOR 5 DAYS BEFORE ISSUING A FINAL NEGATIVE REPORT     Performed at [facility]   Report Status PENDING   Incomplete  CULTURE, BLOOD (ROUTINE X 2)     Status: None   Collection Time    05/20/13  2:40 AM      Result Value Range Status   Specimen Description BLOOD RIGHT ANTECUBITAL   Final   Special Requests     Final   Value: BOTTLES DRAWN AEROBIC AND ANAEROBIC AER 5cc ANA 5cc   Culture  Setup Time     Final   Value: 05/20/2013 06:46     Performed at [facility]   Culture     Final   Value:        BLOOD CULTURE RECEIVED NO GROWTH TO DATE CULTURE WILL BE HELD FOR 5 DAYS BEFORE ISSUING A FINAL NEGATIVE REPORT     Performed at [facility]   Report Status PENDING   Incomplete  MRSA PCR SCREENING     Status: None    Collection Time    05/20/13  7:47 AM      Result Value Range Status   MRSA by PCR NEGATIVE  NEGATIVE Final   Comment:            The GeneXpert MRSA Assay (FDA     approved for NASAL specimens     only), is one component of a     comprehensive MRSA colonization  surveillance program. It is not     intended to diagnose MRSA     infection nor to guide or     monitor treatment for     MRSA infections.     Studies: Dg Chest 2 View  05/20/2013   CLINICAL DATA:  Chest pain.  EXAM: CHEST  2 VIEW  COMPARISON:  01/21/2009  FINDINGS: Extensive subcutaneous emphysema in the lower neck and upper left chest. There is pneumomediastinum with gas outlining the upper mediastinal more than lower mediastinal structures. Pneumomediastinum was seen on preceding abdominal CT. No definitive pneumothorax. No effusion, acute infiltrate, or pulmonary edema. No evidence of emphysema or airway obstruction. Normal heart size. No acute osseous trauma.  IMPRESSION: Pneumomediastinum and subcutaneous emphysema, preferentially in the upper chest.   Electronically Signed   By: Tiburcio Pea M.D.   On: 05/20/2013 02:41   Ct Chest W Contrast  05/20/2013   *RADIOLOGY REPORT*  Clinical Data: Pneumomediastinum, assess for esophageal rupture.  CT CHEST WITH CONTRAST  Technique:  Multidetector CT imaging of the chest was performed following the standard protocol during bolus administration of intravenous contrast.  Contrast: 75mL OMNIPAQUE IOHEXOL 300 MG/ML  SOLN  Comparison: Chest radiograph May 20, 2013 and CT of the abdomen and pelvis May 20, 2013 at 0200 hours.  Findings: Please note, there is little contrast within the thoracic structures.  Pneumomediastinum, bubbles of gas around the esophagus with no discrete rent.  Tracheobronchial tree appears patent midline.  No pneumothorax.  Gas dissects into the epidural space, is seen along the left greater than right chest walls extending into the neck. No fluid collections  within the neck. Small bubbles of gas may be intraluminal within the distal thoracic esophagus, axial 46/77.  The heart and pericardium are not suspicious.  No pleural effusions, focal consolidations, pulmonary nodules or masses.  Osseous structures are not suspicious, specifically no rib fracture.  IMPRESSION: Pneumomediastinum , with subcutaneous gas dissecting into the neck and epidural space.  No pneumothorax.  No fluid collections within the mediastinum.  A few bubbles of gas at the level of the distal thoracic esophagus which may in fact be intraluminal. Esophagram may be more sensitive for evaluation of small leak.   Original Report Authenticated By: Awilda Metro   Ct Abdomen Pelvis W Contrast  05/20/2013   *RADIOLOGY REPORT*  Clinical Data: Abdominal pain and emesis, evaluate for appendicitis.  CT ABDOMEN AND PELVIS WITH CONTRAST  Technique:  Multidetector CT imaging of the abdomen and pelvis was performed following the standard protocol during bolus administration of intravenous contrast.  Contrast: OMNIPAQUE IOHEXOL 300 MG/ML  SOLN, 1 OMNIPAQUE IOHEXOL 300 MG/ML  SOLN  Comparison: None available at time of study interpretation.  Findings: Limited view of the lung bases are clear. Partially imaged pneumomediastinum with air within the anterior posterior mediastinum, incompletely assessed.  The liver, spleen, pancreas, gallbladder and adrenal glands are normal in size, morphology and enhancement characteristics.  The stomach, small and large bowel are normal in course and caliber without definite wall thickening or inflammatory changes though sensitivity may be decreased by lack of enteric contrast.  Normal retrocecal appendix.  No free fluid nor free air.  The kidneys are well-located, demonstrating normal morphology, size and enhancement.  Mild right pelvicaliectasis.  2 mm nonobstructing left lower pole renal calculus.  Urinary bladder is partially distended and appears normal.  Great  vessels are normal in course and caliber.  No lymphadenopathy by CT size criteria.  Internal reproductive organs  appear normal. The included soft tissues are unremarkable. Grade 1 L5 S1 anterolisthesis associated with bilateral chronic L5 pars interarticularis defects.  Scattered Schmorl's nodes within the lumbar spine.  IMPRESSION: Partially imaged pneumomediastinum.  2 mm nonobstructing left lower pole renal calculus with mild right renal pelvic celiac disease, no frank hydronephrosis.  Normal appendix.  Findings discussed with and reconfirmed by Dr. Rhunette Croft on May 20, 2013 at 0210 hours.   Original Report Authenticated By: Awilda Metro   Dg Esophagram W/water Sol Cm  05/20/2013   CLINICAL DATA:  Pneumomediastinum. Concern for esophageal rupture  EXAM: ESOPHOGRAM/BARIUM SWALLOW  TECHNIQUE: Single contrast examination was performed using isotonic water soluble contrast, followed by thin barium.  FINDINGS: Scout images over the neck and chest were first obtained. Pneumomediastinum was again seen, preferentially in the upper chest, extending into the lower neck and upper chest wall. The volume of pneumomediastinum is stable from prior chest radiography. Small sips of isotonic water soluble contrast was administered via straw in the upper right position. Due to patient tremulousness and repeated vomiting, positioning and coverage is suboptimal. No esophageal or pharyngeal leak or obstruction was identified. Next, sips of thin barium were administered. No extravasation seen. The caliber and contour of the pharynx and esophagus was normal, without evidence of obstruction. Postprocedure frontal chest radiograph was obtained, showing no effusion, contrast leakage, or pneumothorax.  IMPRESSION: 1. No evidence of esophageal or pharyngeal leak. 2. Stable volume of pneumomediastinum and subcutaneous emphysema compared to earlier the same day.  : COMPARISON:  None.   Electronically Signed   By: Tiburcio Pea  M.D.   On: 05/20/2013 05:35    Scheduled Meds: . LORazepam      . methylphenidate  15 mg Oral BID  . pantoprazole (PROTONIX) IV  40 mg Intravenous Q12H  . sodium chloride  3 mL Intravenous Q12H   Continuous Infusions: . dextrose 5 % and 0.9 % NaCl with KCl 20 mEq/L 100 mL/hr at 05/21/13 1324    Active Problems:   Pneumomediastinum   Nausea & vomiting   Abdominal pain   Leukocytosis   ADD (attention deficit disorder)   Renal calculi    Time spent: 45 minutes    Jeralyn Bennett  [facility] Pager [phone]. If 7PM-7AM, please contact night-coverage at [email or web address], password [facility] 05/21/2013, 2:47 PM  LOS: 1 day

## 2013-05-21 NOTE — Progress Notes (Addendum)
Patient ID: Teige Rountree, male   DOB: May 07, 1991, 22 y.o.   MRN: [id number]      [address]       [place] [address]             [id number]                      LOS: 1 day   Subjective: patient is agitated, in constant motion. Complains of pains migrating all around his body. Says he is hungry and wants something to eat Has not been sleeping Sq air has dissipated Episodic vomiting, nothing comes up   Objective: Vital signs in last 24 hours: Patient Vitals for the past 24 hrs:  BP Temp Temp src Pulse Resp SpO2  05/21/13 1337 123/71 mmHg 98.2 F (36.8 C) Oral 77 16 100 %  05/21/13 0955 134/87 mmHg 99.3 F (37.4 C) Oral 67 18 97 %  05/21/13 0500 125/100 mmHg 97.8 F (36.6 C) Oral 88 20 97 %  05/21/13 0125 123/82 mmHg 97.8 F (36.6 C) Oral 61 20 100 %  05/20/13 2147 127/72 mmHg 98.7 F (37.1 C) Oral 67 18 99 %  05/20/13 1712 129/87 mmHg 97.8 F (36.6 C) - 82 20 100 %    Filed Weights   05/20/13 0055 05/20/13 0743  Weight: 175 lb (79.379 kg) 172 lb 2.9 oz (78.1 kg)    Hemodynamic parameters for last 24 hours:    Intake/Output from previous day: 10/13 0701 - 10/14 0700 In: 900 [I.V.:600; IV Piggyback:300] Out: 1050 [Urine:1050] Intake/Output this shift: Total I/O In: 10 [I.V.:10] Out: 400 [Urine:400]  Scheduled Meds: . imipenem-cilastatin  500 mg Intravenous Q8H  . methylphenidate  15 mg Oral BID  . pantoprazole (PROTONIX) IV  40 mg Intravenous Q12H  . sodium chloride  3 mL Intravenous Q12H  . vancomycin  1,000 mg Intravenous Q8H   Continuous Infusions: . dextrose 5 % and 0.9 % NaCl with KCl 20 mEq/L 100 mL/hr at 05/21/13 1324   PRN Meds:.acetaminophen, acetaminophen, ketorolac, morphine injection, ondansetron (ZOFRAN) IV, ondansetron, promethazine  General appearance: alert, cooperative and moderate distress, sq air has resolved Neurologic: intact Heart: regular rate and rhythm, S1, S2 normal, no murmur, click, rub or gallop Lungs:  clear to auscultation bilaterally Abdomen: soft,  bowel sounds normal; no masses,  no organomegaly, migrating discomfort in abdomen, no rebound Extremities: extremities normal, atraumatic, no cyanosis or edema and Homans sign is negative, no sign of DVT   Lab Results: CBC: Recent Labs  05/20/13 0830 05/21/13 0755  WBC 17.2* 10.9*  HGB 14.2 14.7  HCT 40.5 42.3  PLT 208 229   BMET:  Recent Labs  05/20/13 0830 05/21/13 0755  NA 137 139  K 3.9 3.6  CL 102 103  CO2 18* 21  GLUCOSE 87 83  BUN 7 4*  CREATININE 0.79 0.83  CALCIUM 9.1 9.3    PT/INR: No results found for this basename: LABPROT, INR,  in the last 72 hours   Radiology:  fu chest xray and swallow  in am  Assessment/Plan: S/P   resolving pneumomediastinum /fu  gastrograffin swallow tomorrow then restart po diet Fu chest xray  Diffuse Pain / agitated state is of unknown cause question poss withdrawal syndrome/ consider psy consult  Discussed with primary service primary  Delight Ovens MD 05/21/2013 1:47 PM

## 2013-05-21 NOTE — Progress Notes (Signed)
Fernando Jimenez 11:41 AM  Subjective: Patient is doing better than he was and his pain continues to move to different areas but no further fever or vomiting and he is having increased nervousness and anxiousness and some hallucinations from the pain medicine and he and his mother and I rediscussed his case Objective: Vital signs stable afebrile no acute distress abdomen is soft nontender good bowel sounds decreased white count  Assessment: Mediastinal air secondary to probable esophageal leak with periodic abdominal pain nausea vomiting questionable etiology  Plan: would not advance diet until tomorrow but will allow sips of water only and the warnings were discussed and probably to be complete would repeat a Gastrografin swallow in the morning and if okay slowly advance diet and check with CV TS team on how long to use antibiotics and I'm happy to see back when necessary particularly if abdominal pains continue and we discussed getting his anxiety under control as well Jayln Madeira E

## 2013-05-22 ENCOUNTER — Inpatient Hospital Stay (HOSPITAL_COMMUNITY): Payer: BC Managed Care – PPO

## 2013-05-22 ENCOUNTER — Encounter (HOSPITAL_COMMUNITY): Payer: Self-pay | Admitting: Radiology

## 2013-05-22 DIAGNOSIS — F988 Other specified behavioral and emotional disorders with onset usually occurring in childhood and adolescence: Secondary | ICD-10-CM

## 2013-05-22 DIAGNOSIS — G9340 Encephalopathy, unspecified: Secondary | ICD-10-CM

## 2013-05-22 DIAGNOSIS — R112 Nausea with vomiting, unspecified: Secondary | ICD-10-CM

## 2013-05-22 DIAGNOSIS — J982 Interstitial emphysema: Principal | ICD-10-CM

## 2013-05-22 LAB — PROTIME-INR: INR: 1.17 (ref 0.00–1.49)

## 2013-05-22 LAB — PROCALCITONIN: Procalcitonin: <0.1 ng/mL

## 2013-05-22 LAB — AMYLASE: Amylase: 61 U/L (ref 0–105)

## 2013-05-22 LAB — APTT: aPTT: 30 seconds (ref 24–37)

## 2013-05-22 MED ORDER — ZIPRASIDONE MESYLATE 20 MG IM SOLR
10.0000 mg | Freq: Once | INTRAMUSCULAR | Status: AC
  Administered 2013-05-22: 10 mg via INTRAMUSCULAR
  Filled 2013-05-22: qty 20

## 2013-05-22 MED ORDER — HALOPERIDOL LACTATE 5 MG/ML IJ SOLN
5.0000 mg | Freq: Four times a day (QID) | INTRAMUSCULAR | Status: DC | PRN
  Administered 2013-05-22: 5 mg via INTRAVENOUS
  Filled 2013-05-22: qty 1

## 2013-05-22 MED ORDER — MORPHINE SULFATE 2 MG/ML IJ SOLN: 1.0000 mg | INTRAMUSCULAR | Status: DC | PRN

## 2013-05-22 MED ORDER — LORAZEPAM 2 MG/ML IJ SOLN
1.0000 mg | INTRAMUSCULAR | Status: DC | PRN
  Filled 2013-05-22: qty 1

## 2013-05-22 MED ORDER — HALOPERIDOL LACTATE 5 MG/ML IJ SOLN
2.5000 mg | Freq: Once | INTRAMUSCULAR | Status: AC
Start: 2013-05-22 — End: 2013-05-22
  Administered 2013-05-22: 2.5 mg via INTRAVENOUS
  Filled 2013-05-22: qty 1

## 2013-05-22 MED ORDER — DEXTROSE-NACL 5-0.45 % IV SOLN: INTRAVENOUS | Status: DC

## 2013-05-22 MED ORDER — DEXMEDETOMIDINE HCL IN NACL 200 MCG/50ML IV SOLN
0.0000 ug/kg/h | INTRAVENOUS | Status: AC
  Administered 2013-05-22: 0.4 ug/kg/h via INTRAVENOUS
  Filled 2013-05-22: qty 50

## 2013-05-22 MED ORDER — KCL IN DEXTROSE-NACL 40-5-0.45 MEQ/L-%-% IV SOLN
INTRAVENOUS | Status: DC
  Administered 2013-05-22: 75 mL/h via INTRAVENOUS
  Administered 2013-05-23 (×2): via INTRAVENOUS
  Filled 2013-05-22 (×4): qty 1000

## 2013-05-22 MED ORDER — WHITE PETROLATUM GEL
Status: AC
  Administered 2013-05-22: 11:00:00
  Filled 2013-05-22: qty 5

## 2013-05-22 NOTE — Progress Notes (Signed)
Follow up:  Received several calls regarding pt's ongoing agitation, restlessness and persistent hallucinations. Pt's father has been at bedside in an attempt to help manage pt's behavioral outbursts which have now become refractory to several doses of Ativan and Haldol. RN now reports pt has become combative and threatening violence. Security and [employer] officer have been called and are at bedside. Father reports this episode started when he allowed pt to go to [facility] to have BM. When he heard pt talking to himself and making no sense he attempted to help him get cleaned up w/ the nurses help which is when pt began to strike out at staff. At bedside pt is now more calm but remains oriented only to self and continues to appear very paranoid and unpredictable. Dr Toniann Fail has come to bedside at my request in anticipation for possible need for Geodon. Geodon 10 mg given IM in preparation for transfer to SDU. Pt transferred to SDU given acutely increased acuity. Spoke w/ Loraine Leriche, RN  (receiving RN) who reports that pt is still awake and somewhat restless but is relatively cooperative at this time though remains oriented to self only and continues to have hallucinations. VS have remained stable through-out.  Assessment/Plan: 1. Agitation/Hallucinations: Etiology unclear though felt possibly d/t recent use of synthetic marijuana. Will continue to monitor closely in SDU and defer further changes in pt's plan of care to rounding MD this am.   Leanne Chang, NP-C [facility] Pager [phone]

## 2013-05-22 NOTE — Progress Notes (Addendum)
S: Vacillates between agitation and somnolence on dex infusion.   O:  Filed Vitals:   05/22/13 1600 05/22/13 1700 05/22/13 1800 05/22/13 1900  BP: 132/87 123/88 125/82 124/79  Pulse: 107 70 84 87  Temp:      TempSrc:      Resp: 29 20 22 24   Height:      Weight:      SpO2: 98% 98% 96% 96%    WDWN, encephalopathic, F/C intermittently HEENT WNL No JVD Chest clear RRR s M Abdomen tender in suprapubic region Ext warm without edema  BMET    Component Value Date/Time   NA 139 05/21/2013 0755   K 3.6 05/21/2013 0755   CL 103 05/21/2013 0755   CO2 21 05/21/2013 0755   GLUCOSE 83 05/21/2013 0755   BUN 4* 05/21/2013 0755   CREATININE 0.83 05/21/2013 0755   CALCIUM 9.3 05/21/2013 0755   GFRNONAA >90 05/21/2013 0755   GFRAA >90 05/21/2013 0755    CBC    Component Value Date/Time   WBC 10.9* 05/21/2013 0755   RBC 4.72 05/21/2013 0755   HGB 14.7 05/21/2013 0755   HCT 42.3 05/21/2013 0755   PLT 229 05/21/2013 0755   MCV 89.6 05/21/2013 0755   MCH 31.1 05/21/2013 0755   MCHC 34.8 05/21/2013 0755   RDW 12.7 05/21/2013 0755   LYMPHSABS 1.4 05/20/2013 0830   MONOABS 0.7 05/20/2013 0830   EOSABS 0.0 05/20/2013 0830   BASOSABS 0.0 05/20/2013 0830    No new CXR  IMPRESSION: Agitated delirium - unclear etiology. Prolonged effect of undefined ingestion vs adverse effect of opioids  Pneumomediastinum - after vigorous vomiting. Likely micro perf of esophagus  Urinary retention (bladder scan > 650 cc)  Was able to void when standing with assistance  PLAN: Try to keep off dexmedetomidine GI and TCTS following Only sips and ice chips unitl repeat esophagram  Father updated in detail Repeat CXR in AM 10/16   Billy Fischer, MD ; [facility] service Mobile [phone].  After 5:30 PM or weekends, call [phone]

## 2013-05-22 NOTE — Progress Notes (Signed)
Reassessment: Notified by RN that pt is once again combative, attempting to get OOB, and becoming threatening and violent w/ staff. I have ordered pt to be placed in restaints, waist belt, bil soft wrists and ankles. Discussed pt w/ Dr Darrick Penna w/ [facility] who states she is aware of the pt and has ask Dr Marin Shutter to evaluate pt.  Leanne Chang, NP-C [facility] Pager [phone]

## 2013-05-22 NOTE — Progress Notes (Signed)
Fernando Jimenez 10:00 AM  Subjective: Patient heavily sedated and currently sleeping and condition discussed with his father  Objective: Vital signs stable afebrile no acute distress sleeping as above no new lab Head CT okay  Assessment:  altered mental status and pneumomediastinum  Plan: When able would repeat Gastrografin swallow possibly followed by barium swallow and if okay may have clear liquid for 24 hours and if okay then soft solids and I am happy to see back when necessary or as outpatient if GI problems continue  [facility] E

## 2013-05-22 NOTE — Progress Notes (Signed)
Heard pt screaming, went in the room, dad on top of pt trying to calm him down,dad tried to wipe his bottom since pt just got off the bathroom and pt became violent, screaming and agitated,staff in the room to calm pt but continues to be violent. Rapid response RN,security and MD called and in the room to see pt. Order to transfer pt  to stepdown,report called to [employer]. VS 127/73, 112 19 98.2 96% .Pt transferred by rapid response RN with father to [phone].

## 2013-05-22 NOTE — Consult Note (Signed)
PULMONARY  / CRITICAL CARE MEDICINE  Name: Fernando Jimenez MRN: [id number] DOB: 02/26/1991    ADMISSION DATE:  05/20/2013 CONSULTATION DATE:  05/21/2013  REFERRING MD :  [facility] PRIMARY SERVICE:  PCCM  CHIEF COMPLAINT:  Acute delirium  BRIEF PATIENT DESCRIPTION: 22 yo with suspected illicit drug use admitted to [facility] service with nausea, vomiting and pneumomediastinum. Developed acute delirium requiring 4 point restraints and multiple doses of sedatives and antipsychotics.  Transferred to ICU for Precedex gtt.  SIGNIFICANT EVENTS / STUDIES:  10/13  Admitted with nausea, vomiting and pneumomediastinum  10/13  Chest CT >>> Pneumomediastinum, no pneumothorax 10/13  Esophagram >>> No leak 10/14  Acute delirium, transferred to ICU for Precedex gtt  LINES / TUBES:  CULTURES:  ANTIBIOTICS:  The patient is encephalopathic and unable to provide history, which was obtained for available medical records.  HISTORY OF PRESENT ILLNESS:  22 yo with suspected illicit drug use admitted to [facility] service with nausea, vomiting and pneumomediastinum. Developed acute delirium requiring 4 point restraints and multiple doses of sedatives and antipsychotics.  Transferred to ICU for Precedex gtt.  PAST MEDICAL HISTORY :  Past Medical History  Diagnosis Date  . GERD (gastroesophageal reflux disease)     prn OTC  . Bimalleolar ankle fracture 08/07/2011    left  - with disruption syndesmosis  . ADHD (attention deficit hyperactivity disorder)   . Pneumomediastinum 05/20/2013  . Shortness of breath     "just since admission" (05/20/2013)  . [id number](478.2)     "weekly" (05/20/2013)   Past Surgical History  Procedure Laterality Date  . Orif ankle fracture  08/10/2011    Procedure: OPEN REDUCTION INTERNAL FIXATION (ORIF) ANKLE FRACTURE;  Surgeon: Mable Paris, MD;  Location: [facility];  Service: Orthopedics;  Laterality: Left;   Prior to Admission medications   Medication Sig  Start Date End Date Taking? Authorizing Provider  methylphenidate (CONCERTA) 54 MG CR tablet Take 54 mg by mouth daily as needed (only takes when needed for concentration).   Yes Historical Provider, MD  oxyCODONE-acetaminophen (PERCOCET) 5-325 MG per tablet Take 1 tablet by mouth every 4 (four) hours as needed.     Historical Provider, MD   No Known Allergies  FAMILY HISTORY:  Family History  Problem Relation Age of Onset  . Diabetes Mellitus II Other    SOCIAL HISTORY:  reports that he has been smoking Cigarettes.  He has a .24 pack-year smoking history. He has never used smokeless tobacco. He reports that he drinks about 1.8 ounces of alcohol per week. He reports that he does not use illicit drugs.  REVIEW OF SYSTEMS:  Unable to provide.  INTERVAL HISTORY:  VITAL SIGNS: Temp:  [98.2 F (36.8 C)-99.3 F (37.4 C)] 98.2 F (36.8 C) (10/15 0400) Pulse Rate:  [67-115] 115 (10/15 0530) Resp:  [16-28] 26 (10/15 0530) BP: (123-134)/(64-88) 134/80 mmHg (10/15 0445) SpO2:  [91 %-100 %] 91 % (10/15 0530) Weight:  [79.2 kg (174 lb 9.7 oz)] 79.2 kg (174 lb 9.7 oz) (10/15 0445)  HEMODYNAMICS:   VENTILATOR SETTINGS:   INTAKE / OUTPUT: Intake/Output     10/14 0701 - 10/15 0700   I.V. (mL/kg) 1510 (19.1)   Total Intake(mL/kg) 1510 (19.1)   Urine (mL/kg/hr) 600 (0.3)   Other 400 (0.2)   Total Output 1000   Net +510       Stool Occurrence 1 x     PHYSICAL EXAMINATION: General:  Well sedated in for point restraints, no  distress Neuro:  Encephalopathic, nonfocal, cough / gag present HEENT:  PERRL Cardiovascular:  RRR, tachycardia, no m/r/g Lungs:  Bilateral air entry, no w/r/r Abdomen:  Soft, nontender, bowel sounds present Musculoskeletal:  Moves all extremities, no edema Skin:  Intact  LABS:  Recent Labs Lab 05/20/13 0122 05/20/13 0134 05/20/13 0135 05/20/13 0459 05/20/13 0830 05/21/13 0755  HGB 17.2* 18.0*  --   --  14.2 14.7  WBC 12.6*  --   --   --  17.2* 10.9*   PLT 264  --   --   --  208 229  NA 139 139  --   --  137 139  K 4.5 4.4  --   --  3.9 3.6  CL 96 103  --   --  102 103  CO2 23  --   --   --  18* 21  GLUCOSE 114* 111*  --   --  87 83  BUN 12 12  --   --  7 4*  CREATININE 1.00 1.00  --   --  0.79 0.83  CALCIUM 11.4*  --   --   --  9.1 9.3  AST  --   --   --   --  20  --   ALT  --   --   --   --  11  --   ALKPHOS  --   --   --   --  83  --   BILITOT  --   --   --   --  0.6  --   PROT  --   --   --   --  7.3  --   ALBUMIN  --   --   --   --  4.5  --   LATICACIDVEN  --   --  3.69* 2.22* 1.2  --   TROPONINI  --   --   --   --  <0.30  --     Recent Labs Lab 05/21/13 0011 05/21/13 0638 05/21/13 1140 05/21/13 1819 05/21/13 2338  GLUCAP 76 97 86 90 96   CXR:  10/13 >>> Pneumomediastinum,  Farm emphysema  ASSESSMENT / PLAN:  PULMONARY A:  Pneumomediastinum. P:   Gaol SpO2>92 Supplemental oxygen PRN Trend CXR No indication for chest tubes  CARDIOVASCULAR A: Tachycardia, physiologic. P:  Cardiac monitoring  RENAL A:  No active issues. P:   D5 1/2NS@100  Trend BMP  GASTROINTESTINAL A:  Nausea, vomiting, likely secondary to drug intoxication / withdrawal. H/o GERD. P:   Zofran / Phenergan Protonix IV  HEMATOLOGIC A:  No active issues. P:  Trend CBC SCDs for DVT Px  INFECTIOUS A:  Mild leucocytosis, reactive? P:   Defer abx for now PCT Consider LP  ENDOCRINE  A:  No active issues. P:   No intervention required  NEUROLOGIC A:  Acute delirium.  Likely designer drug intoxication / withdrawal.  Tox screen negative. H/o ADD. P:   D/c Ritalin Precedex gtt Ativan PRN Restrains Head CT  I have personally obtained history, examined patient, evaluated and interpreted laboratory and imaging results, reviewed medical records, formulated assessment / plan and placed orders.  CRITICAL CARE:  The patient is critically ill with multiple organ systems failure and requires high complexity decision making for  assessment and support, frequent evaluation and titration of therapies, application of advanced monitoring technologies and extensive interpretation of multiple databases. Critical Care Time devoted to patient care services described in this note is  40 minutes.   Lonia Farber, MD Pulmonary and Critical Care Medicine [facility] Pager: [phone]  05/22/2013, 6:35 AM

## 2013-05-23 ENCOUNTER — Inpatient Hospital Stay (HOSPITAL_COMMUNITY): Payer: BC Managed Care – PPO

## 2013-05-23 LAB — CBC
Hemoglobin: 14.5 g/dL (ref 13.0–17.0)
MCHC: 35 g/dL (ref 30.0–36.0)
MCV: 90.4 fL (ref 78.0–100.0)
Platelets: 218 10*3/uL (ref 150–400)
RDW: 12.7 % (ref 11.5–15.5)
WBC: 8.7 10*3/uL (ref 4.0–10.5)

## 2013-05-23 LAB — BASIC METABOLIC PANEL
BUN: 3 mg/dL — ABNORMAL LOW (ref 6–23)
Calcium: 9.4 mg/dL (ref 8.4–10.5)
GFR calc Af Amer: >90 mL/min (ref >90)
GFR calc non Af Amer: >90 mL/min (ref >90)
Potassium: 3.7 mEq/L (ref 3.5–5.1)
Sodium: 141 mEq/L (ref 135–145)

## 2013-05-23 MED ORDER — IOHEXOL 300 MG/ML  SOLN: 150.0000 mL | Freq: Once | INTRAMUSCULAR | Status: DC | PRN

## 2013-05-23 NOTE — Progress Notes (Signed)
Pt returned from Radiology; Dr. Sung Amabile notitied of pt's vomiting & inability to complete gastrograffin study.

## 2013-05-23 NOTE — Progress Notes (Signed)
Dr. Bard Herbert called and instructed to change pt. diet to ice chips and sips of water for possible plan of test in am, family at the bedside and explained instructed about the diet. Pt. and mom verbalized understanding.

## 2013-05-23 NOTE — Discharge Summary (Signed)
Physician Discharge Summary  Patient ID: Fernando Jimenez MRN: [id number] DOB/AGE: July 02, 1991 22 y.o.  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  Discharged Condition: stable  BP 129/81  Pulse 65  Temp(Src) 97.7 F (36.5 C) (Oral)  Resp 14  Ht 5\' 11"  (1.803 m)  Wt 79.2 kg (174 lb 9.7 oz)  BMI 24.36 kg/m2  SpO2 99%  WDWN, NAD  HEENT WNL  No JVD  Chest clear  RRR s M  Abdomen soft, NT, NABS  Ext warm without edema  CBC Recent Labs     05/23/13  0415  WBC  8.7  HGB  14.5  HCT  41.4  PLT  218    Coag's Recent Labs     05/22/13  0720  APTT  30  INR  1.17    BMET Recent Labs     05/23/13  0415  NA  141  K  3.7  CL  104  CO2  24  BUN  3*  CREATININE  0.81  GLUCOSE  92    Electrolytes Recent Labs     05/23/13  0415  CALCIUM  9.4    Sepsis Markers Recent Labs     05/22/13  0720  PROCALCITON  <0.10    ABG No results found for this basename: PHART, PCO2ART, PO2ART,  in the last 72 hours  Liver Enzymes No results found for this basename: AST, ALT, ALKPHOS, BILITOT, ALBUMIN,  in the last 72 hours  Cardiac Enzymes No results found for this basename: TROPONINI, PROBNP,  in the last 72 hours  Glucose Recent Labs     05/21/13  1140  05/21/13  1819  05/21/13  2338  05/22/13  1242  GLUCAP  86  90  96  86    Imaging Dg Chest 2 View  05/24/2013   CLINICAL DATA:  FOLLOW UP mediastinal air  EXAM: CHEST  2 VIEW  COMPARISON:  Prior chest x-ray 05/23/2013  FINDINGS:  Continued resolution of subcutaneous emphysema in the soft tissues of the neck. No further emphysema is noted. Small volume of residual mediastinal air anteriorly best seen on the lateral view. No pneumothorax. The lungs are clear. Cardiac and mediastinal contours are within normal limits.  IMPRESSION: Small residual mediastinal air. Interval resolution of subcutaneous emphysema.   Electronically Signed   By: Malachy Moan M.D.   On: 05/24/2013 07:47   Dg Chest Port 1 View  05/23/2013   *RADIOLOGY REPORT*  Clinical Data: Pneumomediastinum  PORTABLE CHEST - 1 VIEW  Comparison: 05/20/2013  Findings: Interval improvement in the pneumomediastinum and subcutaneous emphysema throughout the clavicular regions.  Normal heart size and vascularity.  Lungs clear.  No effusion or pneumothorax.  Trachea midline.  IMPRESSION: Resolving pneumomediastinum and subcutaneous emphysema   Original Report Authenticated By: Judie Petit. Miles Costain, M.D.    Marland Kitchenl Hospital Course:   He was admitted 10/13 via ED to [facility] service and med-surg floor with the above diagnoses and the following admission history:  Fernando Jimenez is a 22  y.o. male presented to the ER at [facility] with nausea vomiting abdominal pain. Patient's symptoms started yesterday with multiple episodes of nausea vomiting and abdominal pain. Abdominal pain is in multiple areas. Dull aching comes and goes. Patient states that similar episodes that happen at least twice before in the last one month. Episodes have resolved spontaneously. Yesterday patient had a CT abdomen pelvis done which showed pneumomediastinum and patient was transferred to [facility] ER. In the ER patient had CT chest which showed pneumomediastinum and are followed by esophagogram all of which confirmed the pneumomediastinum but there was no active leak from the esophagus. On-call cardiothoracic surgeon Dr. Tyrone Sage has been consulted and will be seeing patient in consult. Patient has been  started antibiotics and kept n.p.o. as requested by Dr. Tyrone Sage. Patient has been having some subjective feeling of fever chills. Denies any headache. Has some chest pain since yesterday after vomiting but presently chest pain-free. Denies any shortness of breath. Patient is not in acute distress.  He was seen in consultation on 10/13 by thoracic surgery with the following impression/recommendations:  1) No radiogaphic evidence of esophageal perforation or PTX, no indication for thoracic surgical intervention, Would keep NPO for now and treat with antibiotics for the possibility of micro perforation of esophagus.  2) Of Obvious Concern is the cause of the episodic development of symptoms that result in fever chill and vomiting. Gi was consulted.   He was seen in consultation by GI medicine on 10/13 with the following recommendations:  Keep n.p.o. for probably 2 more days and then clear liquids and questionable even repeat Gastrografin swallow first and continue pain medicine and antibiotics and await tomorrow's white count and questionably his pain is just due to kidney stone and I will check on tomorrow and call me sooner when necessary  He received hydromorphone for pain. On 10/14, he developed progressive severe agitated delirium and was transferred to ICU and seem by [facility] service with the concern raised for designer drug intoxication (noting negative UDS on admission). He was started on dexmedetomidine with improvement in agitation.   On 10/15, he remained encephalopathic but calm and dexmedetomidine was discontinued. His sensorium cleared substantially over the course of the day and returned to normal by the afternoon/evening.  On 10/16 He was back to his baseline. Per GI medicine recs, a repeat contrast esophagram could not be performed because of vomiting up gastrografin. Discussed w/ GI. Decided to trial clear liquid then mechanically soft diet. He tolerated this well and therefore he was  cleared for d/c as of 10/17     Consults: GI and thoracic surgery  Significant Diagnostic Studies:  10/13 CT abdomen/pelvis: Partially imaged pneumomediastinum. 2 mm nonobstructing left lower pole renal calculus with mild right renal pelvic celiac disease, no frank hydronephrosis. Normal appendix. 10/13 CT chest: Pneumomediastinum with subcutaneous gas dissecting into the neck and epidural space. No pneumothorax. No fluid  collections within the mediastinum. A few bubbles of gas at the level of the distal thoracic esophagus which may in fact be intraluminal. Esophagram may be more sensitive for evaluation of small leak. 10/13 Contrast esophagram: No evidence of esophageal or pharyngeal leak. Stable volume of pneumomediastinum and subcutaneous emphysema compared to earlier the same day. 10/15 CT head: Normal 10/16 Repeat contrast esophagram:    Discharge Exam: Entirely normal.   Disposition: 01-Home or Self Care     Medication List    ASK your doctor about these medications  methylphenidate 54 MG CR tablet  Commonly known as:  CONCERTA  Take 54 mg by mouth daily as needed (only takes when needed for concentration).     oxyCODONE-acetaminophen 5-325 MG per tablet  Commonly known as:  PERCOCET/ROXICET  Take 1 tablet by mouth every 4 (four) hours as needed.         Signed: Billy Fischer 05/23/2013, 11:27 AM

## 2013-05-23 NOTE — Progress Notes (Signed)
Patient ID: Fernando Jimenez, male   DOB: 06-14-1991, 22 y.o.   MRN: [id number]      [address]       [place] [address]             [phone]                      LOS: 3 days   Subjective: Patient  awake and alert and commutative, confused about last two days but much improved from yesterday  Objective: Vital signs in last 24 hours: Patient Vitals for the past 24 hrs:  BP Temp Temp src Pulse Resp SpO2  05/23/13 0800 145/94 mmHg - - 67 20 98 %  05/23/13 0700 - - - 74 22 98 %  05/23/13 0605 124/75 mmHg - - 35 22 93 %  05/23/13 0200 115/73 mmHg - - 59 20 99 %  05/23/13 0009 - 98 F (36.7 C) Oral - - -  05/23/13 0000 129/89 mmHg - - 82 15 98 %  05/22/13 2300 109/78 mmHg - - 84 19 99 %  05/22/13 2200 121/65 mmHg - - 83 19 99 %  05/22/13 2121 - 97.9 F (36.6 C) Oral - - -  05/22/13 2100 139/91 mmHg - - 106 19 99 %  05/22/13 2000 127/88 mmHg - - 83 24 96 %  05/22/13 1900 124/79 mmHg - - 87 24 96 %  05/22/13 1800 125/82 mmHg - - 84 22 96 %  05/22/13 1700 123/88 mmHg - - 70 20 98 %  05/22/13 1600 132/87 mmHg - - 107 29 98 %  05/22/13 1525 - 98.4 F (36.9 C) Oral - - -  05/22/13 1500 121/88 mmHg - - 69 12 100 %  05/22/13 1400 114/85 mmHg - - 73 23 99 %  05/22/13 1300 104/74 mmHg - - 85 22 96 %  05/22/13 1200 108/72 mmHg - - 81 19 97 %  05/22/13 1100 106/60 mmHg - - 79 23 92 %  05/22/13 1000 108/74 mmHg - - 79 25 93 %  05/22/13 0900 108/61 mmHg - - 84 25 94 %    Filed Weights   05/20/13 0055 05/20/13 0743 05/22/13 0445  Weight: 175 lb (79.379 kg) 172 lb 2.9 oz (78.1 kg) 174 lb 9.7 oz (79.2 kg)    Hemodynamic parameters for last 24 hours:    Intake/Output from previous day: 10/15 0701 - 10/16 0700 In: 2296.3 [P.O.:480; I.V.:1816.3] Out: 1276 [Urine:1275; Stool:1] Intake/Output this shift: Total I/O In: 150 [I.V.:150] Out: 200 [Urine:200]  Scheduled Meds: . sodium chloride  3 mL Intravenous Q12H   Continuous Infusions: . dextrose 5 % and  0.45 % NaCl with KCl 40 mEq/L 75 mL/hr at 05/23/13 0026   PRN Meds:.morphine injection, ondansetron (ZOFRAN) IV  General appearance: alert, cooperative and no distress Neurologic: intact Heart: regular rate and rhythm, S1, S2 normal, no murmur, click, rub or gallop Lungs: clear to auscultation bilaterally and normal percussion bilaterally Abdomen: soft, non-tender; bowel sounds normal; no masses,  no organomegaly Extremities: extremities normal, atraumatic, no cyanosis or edema and Homans sign is negative, no sign of DVT Air in neck tissues resolving  Lab Results: CBC: Recent Labs  05/21/13 0755 05/23/13 0415  WBC 10.9* 8.7  HGB 14.7 14.5  HCT 42.3 41.4  PLT 229 218   BMET:  Recent Labs  05/21/13 0755 05/23/13 0415  NA 139 141  K 3.6 3.7  CL 103 104  CO2 21  24  GLUCOSE 83 92  BUN 4* 3*  CREATININE 0.83 0.81  CALCIUM 9.3 9.4    PT/INR:  Recent Labs  05/22/13 0720  LABPROT 14.7  INR 1.17     Radiology Ct Head Wo Contrast  05/22/2013   *RADIOLOGY REPORT*  Clinical Data: No acute change in mental status  CT HEAD WITHOUT CONTRAST  Technique:  Contiguous axial images were obtained from the base of the skull through the vertex without contrast.  Comparison: None.  Findings:  Wallace Cullens white differentiation is maintained.  No CT evidence of acute large territory infarct.  No intraparenchymal or extra-axial mass or hemorrhage.  Normal size and configuration of the ventricles and basilar cisterns.  No midline shift.  There is minimal polypoid mucosal thickening within the right maxillary sinus.  The remaining paranasal sinuses and mastoid air cells are normally aerated.  No air fluid levels.  Regional soft tissues are normal.  No displaced calvarial fracture.  IMPRESSION: 1.  Negative noncontrast head CT. 2.  Minimal polypoid mucosal thickening within the right maxillary sinus.  No air fluid levels.   Original Report Authenticated By: Tacey Ruiz, MD   Dg Chest Port 1  View  05/23/2013   *RADIOLOGY REPORT*  Clinical Data: Pneumomediastinum  PORTABLE CHEST - 1 VIEW  Comparison: 05/20/2013  Findings: Interval improvement in the pneumomediastinum and subcutaneous emphysema throughout the clavicular regions.  Normal heart size and vascularity.  Lungs clear.  No effusion or pneumothorax.  Trachea midline.  IMPRESSION: Resolving pneumomediastinum and subcutaneous emphysema   Original Report Authenticated By: Judie Petit. Miles Costain, M.D.     Assessment/Plan: S/P   Will resume previous plan, PA lat chest xray and swallow this am now patient cooperative if ok resume po intake     Delight Ovens MD 05/23/2013 8:44 AM

## 2013-05-23 NOTE — Progress Notes (Signed)
S: Cognition entirely normal. No complaints. No distress Was unable to perform esophagram due to emesis of Gastrograffin  O:  Filed Vitals:   05/23/13 1000 05/23/13 1100 05/23/13 1200 05/23/13 1330  BP:  143/91  139/102  Pulse: 79 85 87 80  Temp:   98.1 F (36.7 C)   TempSrc:   Oral   Resp: 12 20 14 15   Height:      Weight:      SpO2: 96% 100% 100% 98%    WDWN, NAD HEENT WNL No JVD Chest clear RRR s M Abdomen soft, NT, NABS Ext warm without edema  BMET    Component Value Date/Time   NA 141 05/23/2013 0415   K 3.7 05/23/2013 0415   CL 104 05/23/2013 0415   CO2 24 05/23/2013 0415   GLUCOSE 92 05/23/2013 0415   BUN 3* 05/23/2013 0415   CREATININE 0.81 05/23/2013 0415   CALCIUM 9.4 05/23/2013 0415   GFRNONAA >90 05/23/2013 0415   GFRAA >90 05/23/2013 0415    CBC    Component Value Date/Time   WBC 8.7 05/23/2013 0415   RBC 4.58 05/23/2013 0415   HGB 14.5 05/23/2013 0415   HCT 41.4 05/23/2013 0415   PLT 218 05/23/2013 0415   MCV 90.4 05/23/2013 0415   MCH 31.7 05/23/2013 0415   MCHC 35.0 05/23/2013 0415   RDW 12.7 05/23/2013 0415   LYMPHSABS 1.4 05/20/2013 0830   MONOABS 0.7 05/20/2013 0830   EOSABS 0.0 05/20/2013 0830   BASOSABS 0.0 05/20/2013 0830    CXR: improved mediastinal and SQ gas  IMPRESSION: Agitated delirium - resolved  Pneumomediastinum - radiographically improving  Urinary retention, resolved  PLAN: Discussed with Dr Ewing Schlein Advance diet to full liquids, then soft mechanical - if tolerates, will DC home in AM 10/17 Transfer to med-surg bed   Billy Fischer, MD ; [facility] service Mobile [phone].  After 5:30 PM or weekends, call [phone]

## 2013-05-23 NOTE — Progress Notes (Signed)
Fernando Jimenez 9:22 AM  Subjective: Patient is doing much better and seems under control and his case was rediscussed with he and his family and he has no current complaints  Objective: Vital signs stable afebrile no acute distress abdomen is soft nontender chest x-ray improved we can improved  Assessment: Improved clinically  Plan: Will proceed with Gastrografin swallow and then been swallow just to be sure and then if okay clear liquids today and advance diet tomorrow and can be discharged tomorrow if tolerating by mouth and happy to see back when necessary  [facility]

## 2013-05-24 ENCOUNTER — Inpatient Hospital Stay (HOSPITAL_COMMUNITY): Payer: BC Managed Care – PPO

## 2013-05-26 LAB — CULTURE, BLOOD (ROUTINE X 2): Culture: NO GROWTH

## 2014-02-21 ENCOUNTER — Emergency Department (HOSPITAL_BASED_OUTPATIENT_CLINIC_OR_DEPARTMENT_OTHER): Payer: BC Managed Care – PPO

## 2014-02-21 ENCOUNTER — Emergency Department (HOSPITAL_BASED_OUTPATIENT_CLINIC_OR_DEPARTMENT_OTHER)
Admission: EM | Admit: 2014-02-21 | Discharge: 2014-02-21 | Disposition: A | Discharge status: Home / Self Care | Payer: BC Managed Care – PPO | Attending: Emergency Medicine | Admitting: Emergency Medicine

## 2014-02-21 ENCOUNTER — Encounter (HOSPITAL_BASED_OUTPATIENT_CLINIC_OR_DEPARTMENT_OTHER): Payer: Self-pay | Admitting: Emergency Medicine

## 2014-02-21 DIAGNOSIS — Y9351 Activity, roller skating (inline) and skateboarding: Secondary | ICD-10-CM | POA: Insufficient documentation

## 2014-02-21 DIAGNOSIS — F17200 Nicotine dependence, unspecified, uncomplicated: Secondary | ICD-10-CM | POA: Diagnosis not present

## 2014-02-21 DIAGNOSIS — Z8781 Personal history of (healed) traumatic fracture: Secondary | ICD-10-CM | POA: Diagnosis not present

## 2014-02-21 DIAGNOSIS — S62009A Unspecified fracture of navicular [scaphoid] bone of unspecified wrist, initial encounter for closed fracture: Secondary | ICD-10-CM | POA: Insufficient documentation

## 2014-02-21 DIAGNOSIS — Z8719 Personal history of other diseases of the digestive system: Secondary | ICD-10-CM | POA: Insufficient documentation

## 2014-02-21 DIAGNOSIS — F909 Attention-deficit hyperactivity disorder, unspecified type: Secondary | ICD-10-CM | POA: Insufficient documentation

## 2014-02-21 DIAGNOSIS — Y929 Unspecified place or not applicable: Secondary | ICD-10-CM | POA: Insufficient documentation

## 2014-02-21 DIAGNOSIS — Z8709 Personal history of other diseases of the respiratory system: Secondary | ICD-10-CM | POA: Diagnosis not present

## 2014-02-21 DIAGNOSIS — S59909A Unspecified injury of unspecified elbow, initial encounter: Secondary | ICD-10-CM | POA: Diagnosis present

## 2014-02-21 DIAGNOSIS — S62001A Unspecified fracture of navicular [scaphoid] bone of right wrist, initial encounter for closed fracture: Secondary | ICD-10-CM

## 2014-02-21 MED ORDER — HYDROCODONE-ACETAMINOPHEN 5-325 MG PO TABS: 1.0000 | ORAL_TABLET | ORAL | Status: DC | PRN

## 2014-02-21 MED ORDER — HYDROCODONE-ACETAMINOPHEN 5-325 MG PO TABS: 1.0000 | ORAL_TABLET | ORAL | Status: AC | PRN

## 2014-02-21 NOTE — ED Provider Notes (Signed)
CSN: [id number]     Arrival date & time 02/21/14  2122 History   First MD Initiated Contact with Patient 02/21/14 2131     Chief Complaint  Patient presents with  . Wrist Injury     (Consider location/radiation/quality/duration/timing/severity/associated sxs/prior Treatment) Patient is a 23 y.o. male presenting with wrist injury. The history is provided by the patient. No language interpreter was used.  Wrist Injury Location:  Wrist Wrist location:  R wrist Chronicity:  New Handedness:  Right-handed Dislocation: no   Foreign body present:  No foreign bodies Associated symptoms: no fever   Associated symptoms comment:  He fell onto flexed wrist while skateboarding earlier today. He complains of pain to radial right wrist.    Past Medical History  Diagnosis Date  . GERD (gastroesophageal reflux disease)     prn OTC  . Bimalleolar ankle fracture 08/07/2011    left  - with disruption syndesmosis  . ADHD (attention deficit hyperactivity disorder)   . Pneumomediastinum 05/20/2013  . Shortness of breath     "just since admission" (05/20/2013)  . [id number](478.2)     "weekly" (05/20/2013)   Past Surgical History  Procedure Laterality Date  . Orif ankle fracture  08/10/2011    Procedure: OPEN REDUCTION INTERNAL FIXATION (ORIF) ANKLE FRACTURE;  Surgeon: Mable Paris, MD;  Location: [facility];  Service: Orthopedics;  Laterality: Left;   Family History  Problem Relation Age of Onset  . Diabetes Mellitus II Other    History  Substance Use Topics  . Smoking status: Current Every Day Smoker -- 0.12 packs/day for 2 years    Types: Cigarettes  . Smokeless tobacco: Never Used  . Alcohol Use: 1.8 oz/week    3 Cans of beer per week     Comment: 05/20/2013 "2-3 beers once weekly "    Review of Systems  Constitutional: Negative for fever and chills.  Musculoskeletal: Negative.        See HPI  Skin: Negative.  Negative for wound.  Neurological: Negative.   Negative for numbness.      Allergies  Review of patient's allergies indicates no known allergies.  Home Medications   Prior to Admission medications   Medication Sig Start Date End Date Taking? Authorizing Provider  methylphenidate (CONCERTA) 54 MG CR tablet Take 54 mg by mouth daily as needed (only takes when needed for concentration).    Historical Provider, MD   BP 140/86  Pulse 79  Temp(Src) 98.4 F (36.9 C) (Oral)  Resp 20  Ht 5\' 11"  (1.803 m)  Wt 180 lb (81.647 kg)  BMI 25.12 kg/m2  SpO2 98% Physical Exam  Constitutional: He is oriented to person, place, and time. He appears well-developed and well-nourished.  Neck: Normal range of motion.  Pulmonary/Chest: Effort normal.  Musculoskeletal: Normal range of motion.  Right wrist swollen without bony deformity or discoloration. Tender most over volar and radial aspect. Pain with thumb movement, however, full range of motion preserved.   Neurological: He is alert and oriented to person, place, and time.  Skin: Skin is warm and dry.  Psychiatric: He has a normal mood and affect.    ED Course  Procedures (including critical care time) Labs Review Labs Reviewed - No data to display  Imaging Review Dg Wrist Complete Right  02/21/2014   CLINICAL DATA:  Right wrist pain  EXAM: RIGHT WRIST - COMPLETE 3+ VIEW  COMPARISON:  None.  FINDINGS: There is a nondisplaced fracture of the waist of the  scaphoid. There is no evidence of arthropathy or other focal bone abnormality. Soft tissues are unremarkable.  IMPRESSION: 1. Nondisplaced fracture of the right scaphoid.   Electronically Signed   By: Elige KoHetal  Patel   On: 02/21/2014 21:39     EKG Interpretation None      MDM   Final diagnoses:  None    1. Scaphoid fracture, right, nondisplaced  Splint provided. Referred for orthopedic evaluation next week. Care instructions given.     Arnoldo HookerShari A Fallynn Gravett, PA-C 02/21/14 2206

## 2014-02-21 NOTE — ED Provider Notes (Signed)
Medical screening examination/treatment/procedure(s) were performed by non-physician practitioner and as supervising physician I was immediately available for consultation/collaboration.   EKG Interpretation None        Brynna Dobos J. Fortune Torosian, MD 02/21/14 2213 

## 2014-02-21 NOTE — ED Notes (Signed)
Fell. Injury to his right wrist.

## 2014-02-21 NOTE — Discharge Instructions (Signed)
Cast or Splint Care °Casts and splints support injured limbs and keep bones from moving while they heal. It is important to care for your cast or splint at home.   °HOME CARE INSTRUCTIONS °· Keep the cast or splint uncovered during the drying period. It can take 24 to 48 hours to dry if it is made of plaster. A fiberglass cast will dry in less than 1 hour. °· Do not rest the cast on anything harder than a pillow for the first 24 hours. °· Do not put weight on your injured limb or apply pressure to the cast until your health care provider gives you permission. °· Keep the cast or splint dry. Wet casts or splints can lose their shape and may not support the limb as well. A wet cast that has lost its shape can also create harmful pressure on your skin when it dries. Also, wet skin can become infected. °¨ Cover the cast or splint with a plastic bag when bathing or when out in the rain or snow. If the cast is on the trunk of the body, take sponge baths until the cast is removed. °¨ If your cast does become wet, dry it with a towel or a blow dryer on the cool setting only. °· Keep your cast or splint clean. Soiled casts may be wiped with a moistened cloth. °· Do not place any hard or soft foreign objects under your cast or splint, such as cotton, toilet paper, lotion, or powder. °· Do not try to scratch the skin under the cast with any object. The object could get stuck inside the cast. Also, scratching could lead to an infection. If itching is a problem, use a blow dryer on a cool setting to relieve discomfort. °· Do not trim or cut your cast or remove padding from inside of it. °· Exercise all joints next to the injury that are not immobilized by the cast or splint. For example, if you have a long leg cast, exercise the hip joint and toes. If you have an arm cast or splint, exercise the shoulder, elbow, thumb, and fingers. °· Elevate your injured arm or leg on 1 or 2 pillows for the first 1 to 3 days to decrease  swelling and pain. It is best if you can comfortably elevate your cast so it is higher than your heart. °SEEK MEDICAL CARE IF:  °· Your cast or splint cracks. °· Your cast or splint is too tight or too loose. °· You have unbearable itching inside the cast. °· Your cast becomes wet or develops a soft spot or area. °· You have a bad smell coming from inside your cast. °· You get an object stuck under your cast. °· Your skin around the cast becomes red or raw. °· You have new pain or worsening pain after the cast has been applied. °SEEK IMMEDIATE MEDICAL CARE IF:  °· You have fluid leaking through the cast. °· You are unable to move your fingers or toes. °· You have discolored (blue or white), cool, painful, or very swollen fingers or toes beyond the cast. °· You have tingling or numbness around the injured area. °· You have severe pain or pressure under the cast. °· You have any difficulty with your breathing or have shortness of breath. °· You have chest pain. °Document Released: 07/22/2000 Document Revised: 05/15/2013 Document Reviewed: 01/31/2013 °[employer]® Patient Information ©2015 [employer]. This information is not intended to replace advice given to you by your health care   provider. Make sure you discuss any questions you have with your health care provider. Cryotherapy Cryotherapy means treatment with cold. Ice or gel packs can be used to reduce both pain and swelling. Ice is the most helpful within the first 24 to 48 hours after an injury or flareup from overusing a muscle or joint. Sprains, strains, spasms, burning pain, shooting pain, and aches can all be eased with ice. Ice can also be used when recovering from surgery. Ice is effective, has very few side effects, and is safe for most people to use. PRECAUTIONS  Ice is not a safe treatment option for people with:  Raynaud's phenomenon. This is a condition affecting small blood vessels in the extremities. Exposure to cold may cause your problems to  return.  Cold hypersensitivity. There are many forms of cold hypersensitivity, including:  Cold urticaria. Red, itchy hives appear on the skin when the tissues begin to warm after being iced.  Cold erythema. This is a red, itchy rash caused by exposure to cold.  Cold hemoglobinuria. Red blood cells break down when the tissues begin to warm after being iced. The hemoglobin that carry oxygen are passed into the urine because they cannot combine with blood proteins fast enough.  Numbness or altered sensitivity in the area being iced. If you have any of the following conditions, do not use ice until you have discussed cryotherapy with your caregiver:  Heart conditions, such as arrhythmia, angina, or chronic heart disease.  High blood pressure.  Healing wounds or open skin in the area being iced.  Current infections.  Rheumatoid arthritis.  Poor circulation.  Diabetes. Ice slows the blood flow in the region it is applied. This is beneficial when trying to stop inflamed tissues from spreading irritating chemicals to surrounding tissues. However, if you expose your skin to cold temperatures for too long or without the proper protection, you can damage your skin or nerves. Watch for signs of skin damage due to cold. HOME CARE INSTRUCTIONS Follow these tips to use ice and cold packs safely.  Place a dry or damp towel between the ice and skin. A damp towel will cool the skin more quickly, so you may need to shorten the time that the ice is used.  For a more rapid response, add gentle compression to the ice.  Ice for no more than 10 to 20 minutes at a time. The bonier the area you are icing, the less time it will take to get the benefits of ice.  Check your skin after 5 minutes to make sure there are no signs of a poor response to cold or skin damage.  Rest 20 minutes or more in between uses.  Once your skin is numb, you can end your treatment. You can test numbness by very lightly  touching your skin. The touch should be so light that you do not see the skin dimple from the pressure of your fingertip. When using ice, most people will feel these normal sensations in this order: cold, burning, aching, and numbness.  Do not use ice on someone who cannot communicate their responses to pain, such as small children or people with dementia. HOW TO MAKE AN ICE PACK Ice packs are the most common way to use ice therapy. Other methods include ice massage, ice baths, and cryo-sprays. Muscle creams that cause a cold, tingly feeling do not offer the same benefits that ice offers and should not be used as a substitute unless recommended by your caregiver.  To make an ice pack, do one of the following:  Place crushed ice or a bag of frozen vegetables in a sealable plastic bag. Squeeze out the excess air. Place this bag inside another plastic bag. Slide the bag into a pillowcase or place a damp towel between your skin and the bag.  Mix 3 parts water with 1 part rubbing alcohol. Freeze the mixture in a sealable plastic bag. When you remove the mixture from the freezer, it will be slushy. Squeeze out the excess air. Place this bag inside another plastic bag. Slide the bag into a pillowcase or place a damp towel between your skin and the bag. SEEK MEDICAL CARE IF:  You develop white spots on your skin. This may give the skin a blotchy (mottled) appearance.  Your skin turns blue or pale.  Your skin becomes waxy or hard.  Your swelling gets worse. MAKE SURE YOU:   Understand these instructions.  Will watch your condition.  Will get help right away if you are not doing well or get worse. Document Released: 03/21/2011 Document Revised: 10/17/2011 Document Reviewed: 03/21/2011 [facility] Patient Information 2015 [place], [place]. This information is not intended to replace advice given to you by your health care provider. Make sure you discuss any questions you have with your health care  provider. Scaphoid Fracture, Wrist A fracture is a break in the bone. The bone you have broken often does not show up as a fracture on x-ray until later on in the healing phase. This bone is called the scaphoid bone. With this bone, your caregiver will often cast or splint your wrist as though it is fractured, even if a fracture is not seen on the x-ray. This is often done with wrist injuries in which there is tenderness at the base of the thumb. An x-ray at 1-3 weeks after your injury may confirm this fracture. A cast or splint is used to protect and keep your injured bone in good position for healing. The cast or splint will be on generally for about 6 to 16 weeks, depending on your health, age, the fracture location and how quickly you heal. Another name for the scaphoid bone is the navicular bone. HOME CARE INSTRUCTIONS   To lessen the swelling and pain, keep the injured part elevated above your heart while sitting or lying down.  Apply ice to the injury for 15-20 minutes, 03-04 times per day while awake, for 2 days. Put the ice in a plastic bag and place a thin towel between the bag of ice and your cast.  If you have a plaster or fiberglass cast or splint:  Do not try to scratch the skin under the cast using sharp or pointed objects.  Check the skin around the cast every day. You may put lotion on any red or sore areas.  Keep your cast or splint dry and clean.  If you have a plaster splint:  Wear the splint as directed.  You may loosen the elastic bandage around the splint if your fingers become numb, tingle, or turn cold or blue.  If you have been put in a removable splint, wear and use as directed.  Do not put pressure on any part of your cast or splint; it may deform or break. Rest your cast or splint only on a pillow the first 24 hours until it is fully hardened.  Your cast or splint can be protected during bathing with a plastic bag. Do not lower the cast or  splint into  water.  Only take over-the-counter or prescription medicines for pain, discomfort, or fever as directed by your caregiver.  If your caregiver has given you a follow up appointment, it is very important to keep that appointment. Not keeping the appointment could result in chronic pain and decreased function. If there is any problem keeping the appointment, you must call back to this facility for assistance. SEEK IMMEDIATE MEDICAL CARE IF:   Your cast gets damaged, wet or breaks.  You have continued severe pain or more swelling than you did before the cast or splint was put on.  Your skin or nails below the injury turn blue or gray, or feel cold or numb.  You have tingling or burning pain in your fingers or increasing pain with movement of your fingers Document Released: 07/15/2002 Document Revised: 10/17/2011 Document Reviewed: 03/13/2009 [facility] Patient Information 2015 [place], [place]. This information is not intended to replace advice given to you by your health care provider. Make sure you discuss any questions you have with your health care provider.

## 2014-02-21 NOTE — ED Notes (Signed)
+  CMS post splint application

## 2014-12-27 IMAGING — CT CT CHEST W/ CM
2 of 3 series · 15 of 36 positions shown, 18 images · IV contrast (omnipaque)
Comparison: Chest radiograph May 20, 2013 and CT of the abdomen
and pelvis May 20, 2013 at 9699 hours.

CLINICAL DATA: Pneumomediastinum, assess for esophageal rupture.

CT CHEST WITH CONTRAST
TECHNIQUE: Multidetector CT imaging of the chest was performed
following the standard protocol during bolus administration of
intravenous contrast.
Contrast: 75mL OMNIPAQUE IOHEXOL 300 MG/ML  SOLN

[Series 2: thorax 5.0 i31f 1 · axial · 0.69mm/px · z∈[+1093,+1418]mm · 12 of 77 slices shown, 15 images]
[im 6/77  mediastinal]
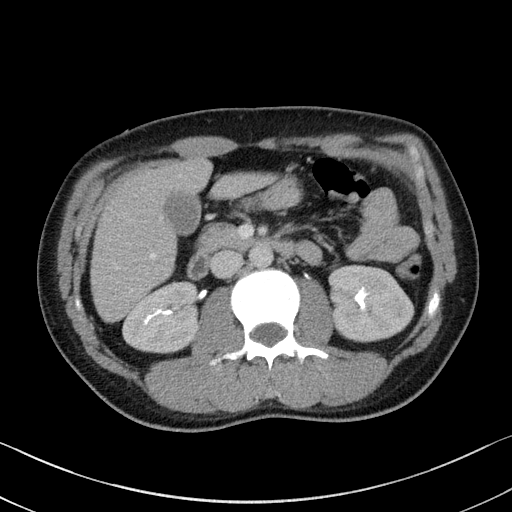
[im 6/77  lung]
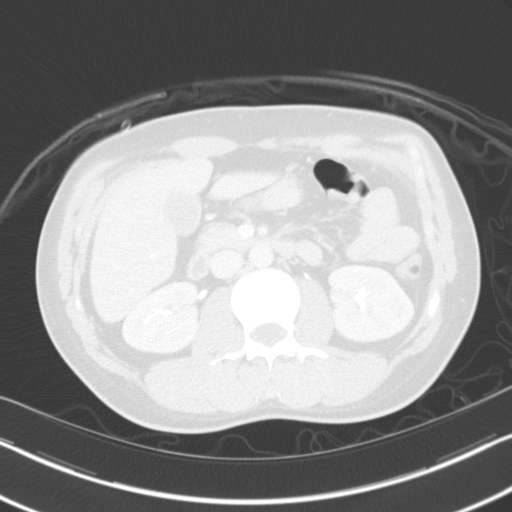
[im 12/77  lung]
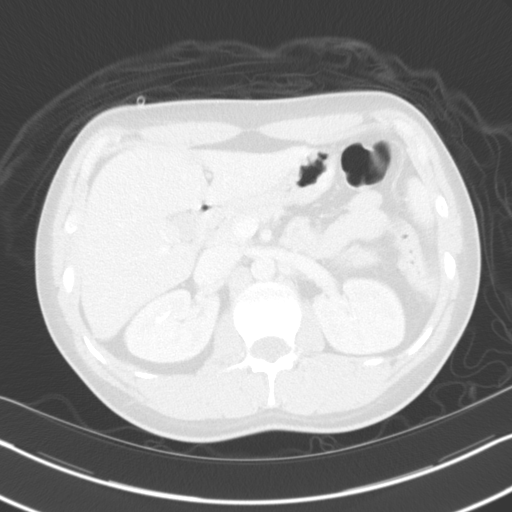
[im 17/77  lung]
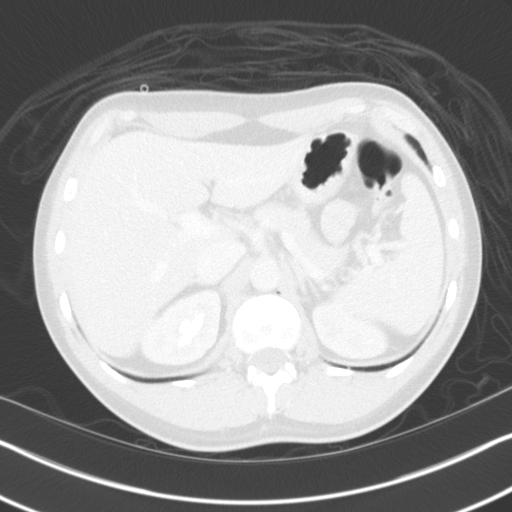
[im 23/77  lung]
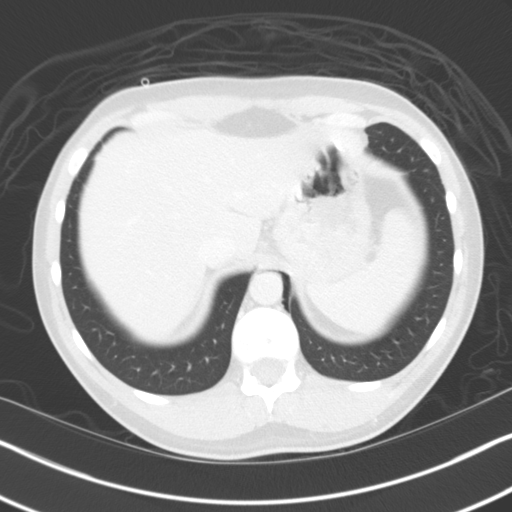
[im 29/77  mediastinal]
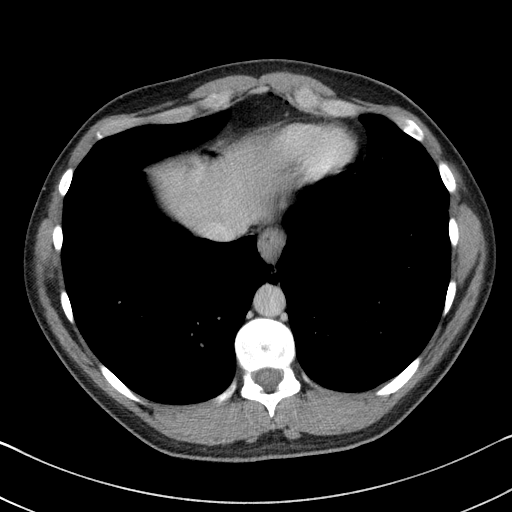
[im 29/77  lung]
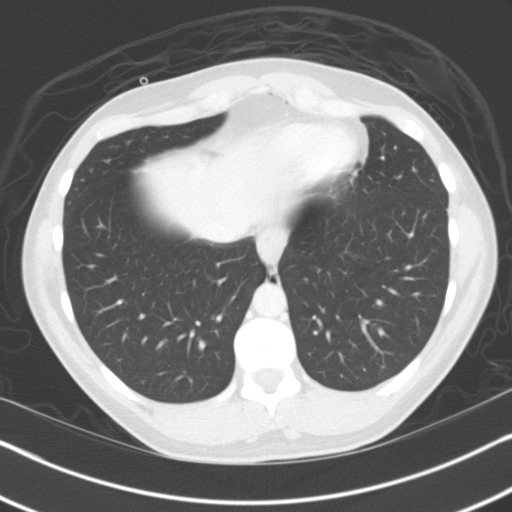
[im 34/77  lung]
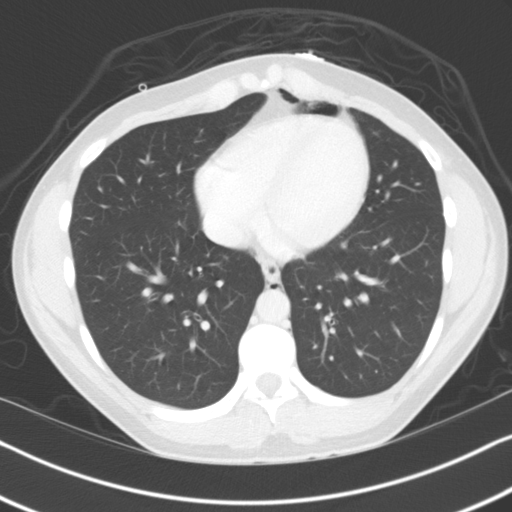
[im 43/77  lung]
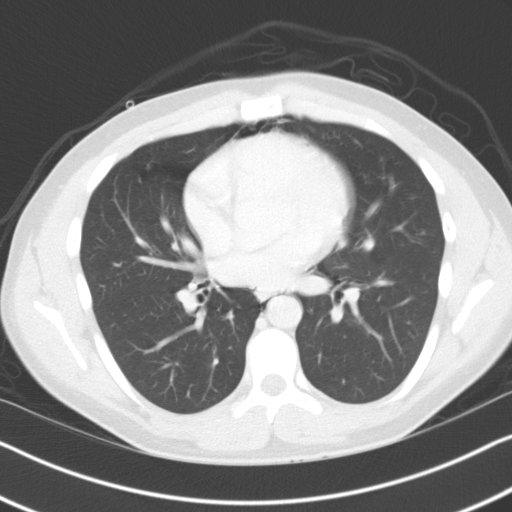
[im 48/77  lung]
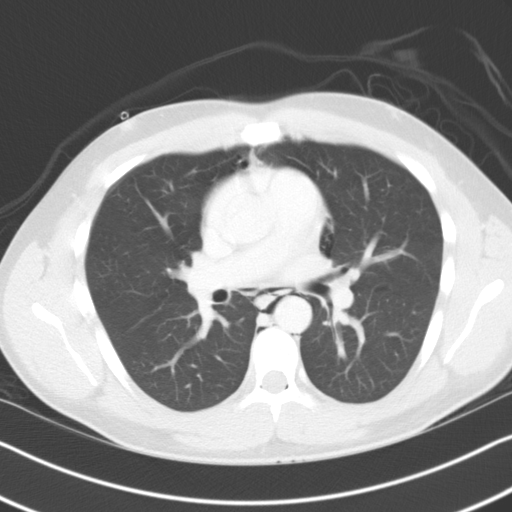
[im 54/77  mediastinal]
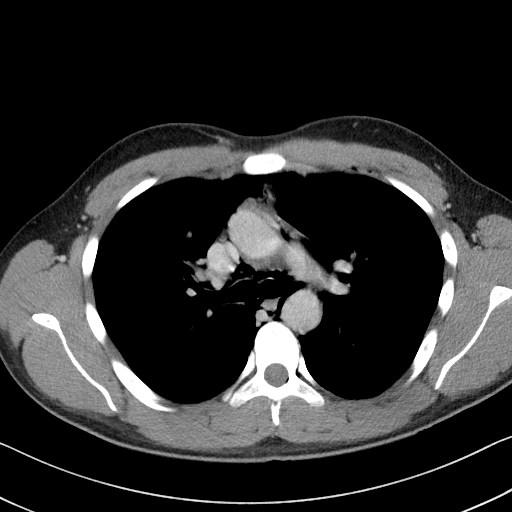
[im 54/77  lung]
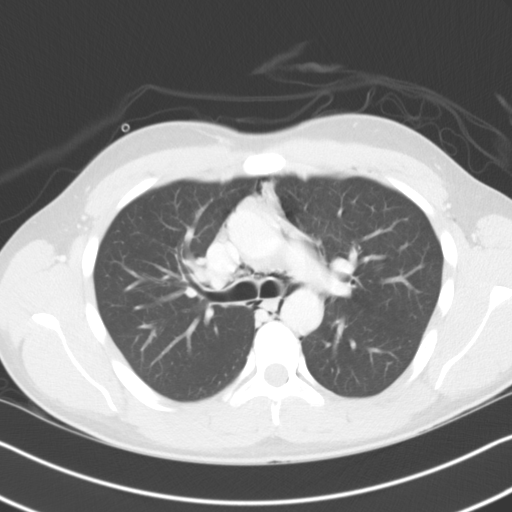
[im 60/77  lung]
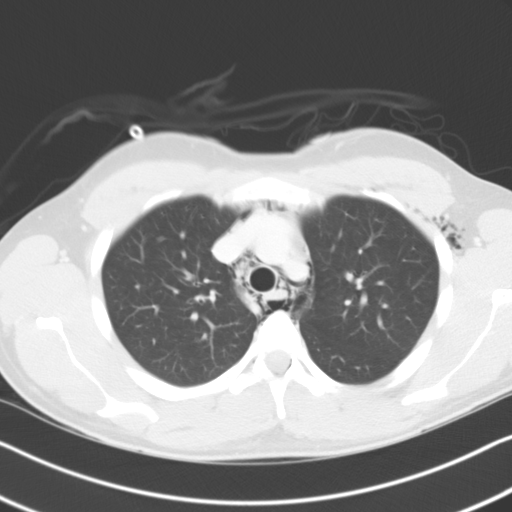
[im 65/77  lung]
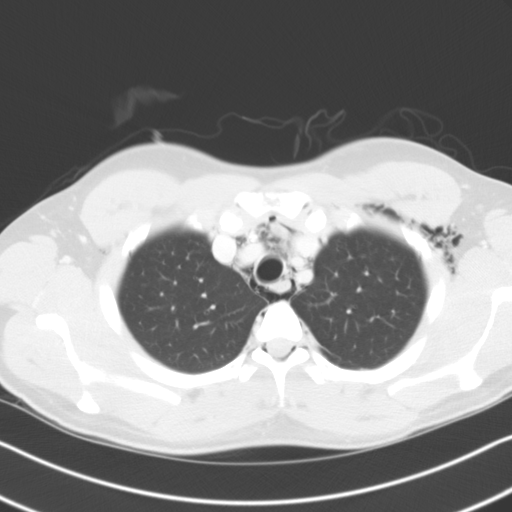
[im 71/77  lung]
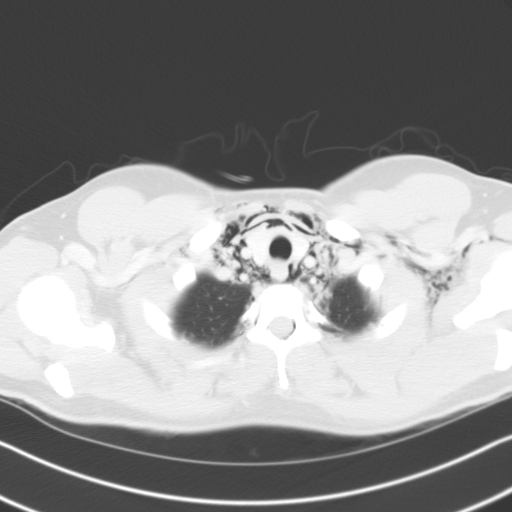

[Series 5: coronal · coronal · 0.71mm/px · 3 of 89 slices shown]
[im 18/89  lung]
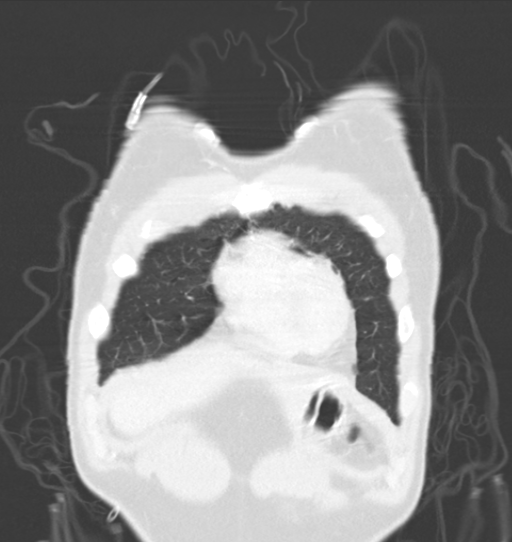
[im 36/89  lung]
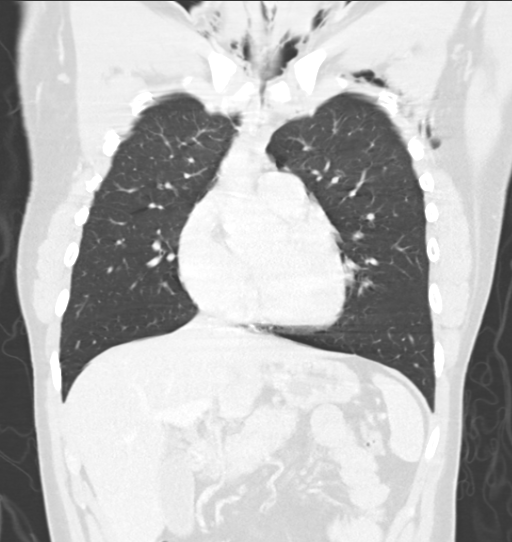
[im 53/89  lung]
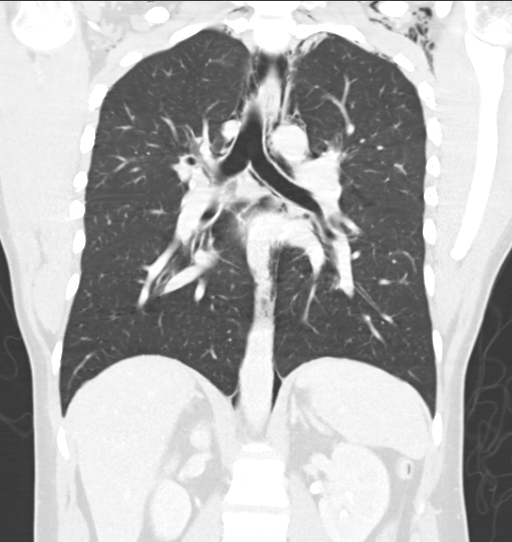

[15 of 36 positions shown; findings below may reference images not displayed]

FINDINGS: Please note, there is little contrast within the thoracic
structures.

Pneumomediastinum, bubbles of gas around the esophagus with no
discrete rent.  Tracheobronchial tree appears patent midline.  No
pneumothorax.  Gas dissects into the epidural space, is seen along
the left greater than right chest walls extending into the neck.
No fluid collections within the neck. Small bubbles of gas may be
intraluminal within the distal thoracic esophagus, axial 46/77.

The heart and pericardium are not suspicious.  No pleural
effusions, focal consolidations, pulmonary nodules or masses.

Osseous structures are not suspicious, specifically no rib
fracture.
IMPRESSION: Pneumomediastinum , with subcutaneous gas dissecting
into the neck and epidural space.  No pneumothorax.  No fluid
collections within the mediastinum.  A few bubbles of gas at the
level of the distal thoracic esophagus which may in fact be
intraluminal. Esophagram may be more sensitive for evaluation of
small leak.]

## 2023-09-24 ENCOUNTER — Encounter: Payer: Self-pay | Admitting: Emergency Medicine

## 2023-09-24 ENCOUNTER — Ambulatory Visit
Admission: EM | Admit: 2023-09-24 | Discharge: 2023-09-24 | Disposition: A | Discharge status: Home / Self Care | Payer: Managed Care, Other (non HMO) | Attending: Internal Medicine | Admitting: Internal Medicine

## 2023-09-24 DIAGNOSIS — L02419 Cutaneous abscess of limb, unspecified: Secondary | ICD-10-CM

## 2023-09-24 DIAGNOSIS — L03119 Cellulitis of unspecified part of limb: Secondary | ICD-10-CM | POA: Diagnosis not present

## 2023-09-24 MED ORDER — CEPHALEXIN 500 MG PO CAPS: 500.0000 mg | ORAL_CAPSULE | Freq: Three times a day (TID) | ORAL | 0 refills | Status: AC

## 2023-09-24 MED ORDER — DOXYCYCLINE HYCLATE 100 MG PO CAPS: 100.0000 mg | ORAL_CAPSULE | Freq: Two times a day (BID) | ORAL | 0 refills | Status: AC

## 2023-09-24 NOTE — Discharge Instructions (Signed)
 We drained your abscess today in the clinic and left open to continue to drain.  Continue to use warm compresses to the wound to further encourage drainage from the wound.  You may do this in the shower as well with gentle compresses to the area to allow more infected material to drain.   Take antibiotic as prescribed to treat infection to the abscess.   Change your dressings frequently as the wound heals.   You may take over the counter pain medicines as needed for pain once the numbing wears off.  If you notice any worsening signs of infection such as redness, swelling, fever, or worsening drainage, please return to urgent care for reevaluation.

## 2023-09-24 NOTE — ED Triage Notes (Addendum)
Pt presents with abscess on right leg for 7 days.  States it is not oozing but is very painful.  He was at [facility] when he first noticed it and is not sure if was from bug bite.

## 2023-09-24 NOTE — ED Provider Notes (Signed)
[facility]    CSN: [id number] Arrival date & time: 09/24/23  1353      History   Chief Complaint Chief Complaint  Patient presents with   Abscess    HPI [facility] is a 33 y.o. male.   [facility] is a 33 y.o. male presenting for chief complaint of Abscess to the right inner thigh that started approximately 1 week ago. He first noticed small area of redness, swelling, and tenderness a week ago while [place] the beach. He does not recall injury to the area of concern. States swelling, redness, and pain has worsened over the last 3-4 days. Area is warm to touch and very painful. Denies drainage from abscess. He has never had an abscess in the past and denies recent antibiotic/steroid use. No history of MRSA infection or immunosuppression. Denies recent fevers, chills, nausea, vomiting, body aches. He has not attempted treatment of symptoms before arrival.     Past Medical History:  Diagnosis Date   ADHD (attention deficit hyperactivity disorder)    Bimalleolar ankle fracture 08/07/2011   left  - with disruption syndesmosis   GERD (gastroesophageal reflux disease)    prn OTC   Headache(784.0)    "weekly" (05/20/2013)   Pneumomediastinum (HCC) 05/20/2013   Shortness of breath    "just since admission" (05/20/2013)    Patient Active Problem List   Diagnosis Date Noted   Encephalopathy acute 05/22/2013   Pneumomediastinum (HCC) 05/20/2013   Nausea & vomiting 05/20/2013   Abdominal pain 05/20/2013   Leukocytosis 05/20/2013   ADD (attention deficit disorder) 05/20/2013   Renal calculi 05/20/2013    Past Surgical History:  Procedure Laterality Date   ORIF ANKLE FRACTURE  08/10/2011   Procedure: OPEN REDUCTION INTERNAL FIXATION (ORIF) ANKLE FRACTURE;  Surgeon: Mable Paris, MD;  Location: [facility];  Service: Orthopedics;  Laterality: Left;       Home Medications    Prior to Admission medications    Medication Sig Start Date End Date Taking? Authorizing Provider  cephALEXin (KEFLEX) 500 MG capsule Take 1 capsule (500 mg total) by mouth 3 (three) times daily for 7 days. 09/24/23 10/01/23 Yes StanhopeDonavan Burnet, FNP  doxycycline (VIBRAMYCIN) 100 MG capsule Take 1 capsule (100 mg total) by mouth 2 (two) times daily for 7 days. 09/24/23 10/01/23 Yes StanhopeDonavan Burnet, FNP  HYDROcodone-acetaminophen (NORCO/VICODIN) 5-325 MG per tablet Take 1-2 tablets by mouth every 4 (four) hours as needed. Patient not taking: Reported on 09/24/2023 02/21/14   Elpidio Anis, PA-C  methylphenidate (CONCERTA) 54 MG CR tablet Take 54 mg by mouth daily as needed (only takes when needed for concentration).    [provider]    Family History Family History  Problem Relation Age of Onset   Diabetes Mellitus II Other     Social History Social History   Tobacco Use   Smoking status: Every Day    Current packs/day: 0.12    Average packs/day: 0.1 packs/day for 2.0 years (0.2 ttl pk-yrs)    Types: Cigarettes   Smokeless tobacco: Never  Substance Use Topics   Alcohol use: Yes    Alcohol/week: 3.0 standard drinks of alcohol    Types: 3 Cans of beer per week    Comment: 05/20/2013 "2-3 beers once weekly "   Drug use: No     Allergies   Patient has no known allergies.   Review of Systems Review of Systems Per HPI  Physical Exam Triage Vital Signs ED Triage Vitals  Encounter Vitals Group     BP 09/24/23 1356 122/81     Systolic BP Percentile --      Diastolic BP Percentile --      Pulse Rate 09/24/23 1356 73     Resp 09/24/23 1356 17     Temp 09/24/23 1356 97.8 F (36.6 C)     Temp Source 09/24/23 1356 Oral     SpO2 09/24/23 1356 97 %     Weight --      Height --      Head Circumference --      Peak Flow --      Pain Score 09/24/23 1359 7     Pain Loc --      Pain Education --      Exclude from Growth Chart --    No data found.  Updated Vital Signs BP 122/81 (BP  Location: Right Arm)   Pulse 73   Temp 97.8 F (36.6 C) (Oral)   Resp 17   SpO2 97%   Visual Acuity Right Eye Distance:   Left Eye Distance:   Bilateral Distance:    Right Eye Near:   Left Eye Near:    Bilateral Near:     Physical Exam Vitals and nursing note reviewed.  Constitutional:      Appearance: He is not ill-appearing or toxic-appearing.  HENT:     Head: Normocephalic and atraumatic.     Right Ear: Hearing and external ear normal.     Left Ear: Hearing and external ear normal.     Nose: Nose normal.     Mouth/Throat:     Lips: Pink.  Eyes:     General: Lids are normal. Vision grossly intact. Gaze aligned appropriately.     Extraocular Movements: Extraocular movements intact.     Conjunctiva/sclera: Conjunctivae normal.  Pulmonary:     Effort: Pulmonary effort is normal.  Musculoskeletal:     Cervical back: Neck supple.  Skin:    General: Skin is warm and dry.     Capillary Refill: Capillary refill takes less than 2 seconds.     Findings: Abscess and erythema present. No rash.          Comments: 2.5-3cm in diameter fluctuant abscess with surrounding erythema, warmth, and soft tissue swelling. Non-draining. Very tender to palpation. See image below.  Neurological:     General: No focal deficit present.     Mental Status: He is alert and oriented to person, place, and time. Mental status is at baseline.     Cranial Nerves: No dysarthria or facial asymmetry.  Psychiatric:        Mood and Affect: Mood normal.        Speech: Speech normal.        Behavior: Behavior normal.        Thought Content: Thought content normal.        Judgment: Judgment normal.    Right proximal thigh abscess with cellulitis   UC Treatments / Results  Labs (all labs ordered are listed, but only abnormal results are displayed) Labs Reviewed - No data to display  EKG   Radiology No results found.  Procedures Incision and Drainage  Date/Time: 09/24/2023 3:13  PM  Performed by: Carlisle Beers, FNP Authorized by: Carlisle Beers, FNP   Consent:    Consent obtained:  Verbal   Consent given by:  Patient   Risks, benefits, and alternatives  were discussed: yes     Risks discussed:  Bleeding, damage to other organs, incomplete drainage, infection and pain Universal protocol:    Patient identity confirmed:  Verbally with patient Location:    Type:  Abscess   Size:  3cm   Location:  Lower extremity   Lower extremity location:  Leg   Leg location:  R upper leg Pre-procedure details:    Skin preparation:  Povidone-iodine and chlorhexidine Sedation:    Sedation type:  None Anesthesia:    Anesthesia method:  Local infiltration   Local anesthetic:  Lidocaine 1% WITH epi Procedure type:    Complexity:  Complex Procedure details:    Incision types:  Stab incision and single straight   Incision depth:  Dermal   Wound management:  Probed and deloculated and irrigated with saline   Drainage:  Bloody and purulent   Drainage amount:  Moderate   Wound treatment:  Wound left open   Packing materials:  None Post-procedure details:    Procedure completion:  Tolerated well, no immediate complications  (including critical care time)  Medications Ordered in UC Medications - No data to display  Initial Impression / Assessment and Plan / UC Course  I have reviewed the triage vital signs and the nursing notes.  Pertinent labs & imaging results that were available during my care of the patient were reviewed by me and considered in my medical decision making (see chart for details).   1. Cellulitis and abscess of leg See incision and drainage note above for further detail regarding incision and drainage procedure, patient tolerated well.   Wound cleansed and dressed in clinic. Wound care discussed (warm compresses, dressing changes, etc).  Doxycycline and keflex antibiotics ordered and supportive care discussed/outlined in AVS.   Counseled  patient on potential for adverse effects with medications prescribed/recommended today, strict ER and return-to-clinic precautions discussed, patient verbalized understanding.   Final Clinical Impressions(s) / UC Diagnoses   Final diagnoses:  Cellulitis and abscess of leg     Discharge Instructions      We drained your abscess today in the clinic and left open to continue to drain.  Continue to use warm compresses to the wound to further encourage drainage from the wound.  You may do this in the shower as well with gentle compresses to the area to allow more infected material to drain.   Take antibiotic as prescribed to treat infection to the abscess.   Change your dressings frequently as the wound heals.   You may take over the counter pain medicines as needed for pain once the numbing wears off.  If you notice any worsening signs of infection such as redness, swelling, fever, or worsening drainage, please return to urgent care for reevaluation.     ED Prescriptions     Medication Sig Dispense Auth. Provider   doxycycline (VIBRAMYCIN) 100 MG capsule Take 1 capsule (100 mg total) by mouth 2 (two) times daily for 7 days. 14 capsule Reita May M, FNP   cephALEXin (KEFLEX) 500 MG capsule Take 1 capsule (500 mg total) by mouth 3 (three) times daily for 7 days. 21 capsule Carlisle Beers, FNP      PDMP not reviewed this encounter.   Carlisle Beers, [place] 09/24/23 2014

## 2023-11-08 ENCOUNTER — Ambulatory Visit
Admission: EM | Admit: 2023-11-08 | Discharge: 2023-11-08 | Disposition: A | Discharge status: Home / Self Care | Attending: Internal Medicine | Admitting: Internal Medicine

## 2023-11-08 ENCOUNTER — Encounter: Payer: Self-pay | Admitting: Emergency Medicine

## 2023-11-08 DIAGNOSIS — L02419 Cutaneous abscess of limb, unspecified: Secondary | ICD-10-CM

## 2023-11-08 DIAGNOSIS — L03119 Cellulitis of unspecified part of limb: Secondary | ICD-10-CM

## 2023-11-08 MED ORDER — DOXYCYCLINE HYCLATE 100 MG PO CAPS: 100.0000 mg | ORAL_CAPSULE | Freq: Two times a day (BID) | ORAL | 0 refills | Status: AC

## 2023-11-08 NOTE — Discharge Instructions (Signed)

## 2023-11-08 NOTE — ED Provider Notes (Signed)
 [facility]    CSN: [id number] Arrival date & time: 11/08/23  1716      History   Chief Complaint Chief Complaint  Patient presents with   Wound Check    HPI [facility] is a 33 y.o. male.   [facility] is a 33 y.o. male presenting for chief complaint of wound check.  Patient was seen September 24, 2023 at urgent care for infected abscess of the right leg where abscess was incised and drained.  Patient was placed on antibiotics (doxycycline and Keflex) and he was able to continue managing abscess on his own at home until abscess resolved.  He began having pain and swelling at the same location 2 to 3 days ago and is concerned that the cyst has developed another abscess and become reinfected.  He notes skin color changes to the area that he describes as "dark".  No recent fevers, chills, nausea, vomiting, injuries to the area on the right leg/inner thigh.  He has been using warm compresses and Tylenol with some relief of discomfort.  He has not noticed any drainage from the abscess.     Past Medical History:  Diagnosis Date   ADHD (attention deficit hyperactivity disorder)    Bimalleolar ankle fracture 08/07/2011   left  - with disruption syndesmosis   GERD (gastroesophageal reflux disease)    prn OTC   Headache(784.0)    "weekly" (05/20/2013)   Pneumomediastinum (HCC) 05/20/2013   Shortness of breath    "just since admission" (05/20/2013)    Patient Active Problem List   Diagnosis Date Noted   Encephalopathy acute 05/22/2013   Pneumomediastinum (HCC) 05/20/2013   Nausea & vomiting 05/20/2013   Abdominal pain 05/20/2013   Leukocytosis 05/20/2013   ADD (attention deficit disorder) 05/20/2013   Renal calculi 05/20/2013    Past Surgical History:  Procedure Laterality Date   ORIF ANKLE FRACTURE  08/10/2011   Procedure: OPEN REDUCTION INTERNAL FIXATION (ORIF) ANKLE FRACTURE;  Surgeon: Mable Paris, MD;  Location: [facility];  Service: Orthopedics;  Laterality: Left;       Home Medications    Prior to Admission medications   Medication Sig Start Date End Date Taking? Authorizing Provider  doxycycline (VIBRAMYCIN) 100 MG capsule Take 1 capsule (100 mg total) by mouth 2 (two) times daily for 7 days. 11/08/23 11/15/23 Yes StanhopeDonavan Burnet, FNP  HYDROcodone-acetaminophen (NORCO/VICODIN) 5-325 MG per tablet Take 1-2 tablets by mouth every 4 (four) hours as needed. Patient not taking: Reported on 09/24/2023 02/21/14   Elpidio Anis, PA-C  methylphenidate (CONCERTA) 54 MG CR tablet Take 54 mg by mouth daily as needed (only takes when needed for concentration).    [provider]    Family History Family History  Problem Relation Age of Onset   Diabetes Mellitus II Other     Social History Social History   Tobacco Use   Smoking status: Every Day    Current packs/day: 0.12    Average packs/day: 0.1 packs/day for 2.0 years (0.2 ttl pk-yrs)    Types: Cigarettes   Smokeless tobacco: Never  Substance Use Topics   Alcohol use: Yes    Alcohol/week: 3.0 standard drinks of alcohol    Types: 3 Cans of beer per week    Comment: 05/20/2013 "2-3 beers once weekly "   Drug use: No     Allergies   Patient has no known allergies.   Review of Systems Review of Systems  Per HPI  Physical Exam Triage Vital Signs ED Triage Vitals  Encounter Vitals Group     BP 11/08/23 1725 122/82     Systolic BP Percentile --      Diastolic BP Percentile --      Pulse Rate 11/08/23 1725 63     Resp 11/08/23 1725 17     Temp 11/08/23 1725 98.1 F (36.7 C)     Temp Source 11/08/23 1725 Oral     SpO2 11/08/23 1725 97 %     Weight --      Height --      Head Circumference --      Peak Flow --      Pain Score 11/08/23 1727 2     Pain Loc --      Pain Education --      Exclude from Growth Chart --    No data found.  Updated Vital Signs BP 122/82 (BP Location: Right Arm)   Pulse 63   Temp 98.1 F  (36.7 C) (Oral)   Resp 17   SpO2 97%   Visual Acuity Right Eye Distance:   Left Eye Distance:   Bilateral Distance:    Right Eye Near:   Left Eye Near:    Bilateral Near:     Physical Exam Vitals and nursing note reviewed.  Constitutional:      Appearance: He is not ill-appearing or toxic-appearing.  HENT:     Head: Normocephalic and atraumatic.     Right Ear: Hearing and external ear normal.     Left Ear: Hearing and external ear normal.     Nose: Nose normal.     Mouth/Throat:     Lips: Pink.  Eyes:     General: Lids are normal. Vision grossly intact. Gaze aligned appropriately.     Extraocular Movements: Extraocular movements intact.     Conjunctiva/sclera: Conjunctivae normal.  Pulmonary:     Effort: Pulmonary effort is normal.  Musculoskeletal:     Cervical back: Neck supple.  Skin:    General: Skin is warm and dry.     Capillary Refill: Capillary refill takes less than 2 seconds.     Findings: Abscess and lesion present. No rash.       Neurological:     General: No focal deficit present.     Mental Status: He is alert and oriented to person, place, and time. Mental status is at baseline.     Cranial Nerves: No dysarthria or facial asymmetry.  Psychiatric:        Mood and Affect: Mood normal.        Speech: Speech normal.        Behavior: Behavior normal.        Thought Content: Thought content normal.        Judgment: Judgment normal.      UC Treatments / Results  Labs (all labs ordered are listed, but only abnormal results are displayed) Labs Reviewed - No data to display  EKG   Radiology No results found.  Procedures Procedures (including critical care time)  Medications Ordered in UC Medications - No data to display  Initial Impression / Assessment and Plan / UC Course  I have reviewed the triage vital signs and the nursing notes.  Pertinent labs & imaging results that were available during my care of the patient were reviewed by me  and considered in my medical decision making (see chart for details).   1.  Cellulitis  and abscess of leg Suspect cyst is becoming reinfected with abscess and surrounding cellulitis. Indurated and immature today on exam, not quite ready for incision and drainage. Doxycycline ordered for 7 days. Encourage warm compresses and use of Tylenol as needed for pain. Ambulatory referral to general surgery placed to have cyst surgically removed to prevent further abscess formation. Infection return precautions discussed.   Counseled patient on potential for adverse effects with medications prescribed/recommended today, strict ER and return-to-clinic precautions discussed, patient verbalized understanding.    Final Clinical Impressions(s) / UC Diagnoses   Final diagnoses:  Cellulitis and abscess of leg     Discharge Instructions      Your abscess has been evaluated in the clinic and appears to be infected, but is not quite ready for drainage.   I would like for you to perform warm compresses to the area frequently and continue taking over the counter medications for pain and inflammation as directed.   Start taking antibiotic sent to pharmacy as directed. This will help reduce infection to area and help the abscess mature/drain.   If abscess does not begin to drain on its own over the next few days and becomes softer, please return to urgent care for this to be drained appropriately.   If you have any fever, chills, nausea, vomiting, or worsening pain/swelling to the area, please return to urgent care for reevaluation.     ED Prescriptions     Medication Sig Dispense Auth. Provider   doxycycline (VIBRAMYCIN) 100 MG capsule Take 1 capsule (100 mg total) by mouth 2 (two) times daily for 7 days. 14 capsule Carlisle Beers, FNP      PDMP not reviewed this encounter.   Carlisle Beers, [place] 11/08/23 1827

## 2023-11-08 NOTE — ED Triage Notes (Signed)
 Pt presents for wound check for abscess on right leg. He was seen on 2/16 and abscess was drained. Wound is now hard and black.
